# Patient Record
Sex: Female | Born: 1960 | Race: White | Hispanic: No | Marital: Married | Smoking: Former smoker
Health system: Southern US, Community
[De-identification: ages and names within clinical notes are randomized; demographics above are authoritative.]

## PROBLEM LIST (undated history)

## (undated) DIAGNOSIS — I10 Essential (primary) hypertension: Secondary | ICD-10-CM

## (undated) DIAGNOSIS — I639 Cerebral infarction, unspecified: Secondary | ICD-10-CM

## (undated) HISTORY — PX: ABDOMINAL HYSTERECTOMY: SHX81

---

## 1998-08-08 ENCOUNTER — Other Ambulatory Visit: Admission: RE | Admit: 1998-08-08 | Discharge: 1998-08-08 | Payer: Self-pay | Admitting: Gynecology

## 1999-07-23 ENCOUNTER — Encounter (INDEPENDENT_AMBULATORY_CARE_PROVIDER_SITE_OTHER): Payer: Self-pay | Admitting: Specialist

## 1999-07-23 ENCOUNTER — Encounter: Payer: Self-pay | Admitting: Gynecology

## 1999-07-23 ENCOUNTER — Ambulatory Visit (HOSPITAL_COMMUNITY): Admission: RE | Admit: 1999-07-23 | Discharge: 1999-07-23 | Payer: Self-pay | Admitting: Gynecology

## 2000-07-28 ENCOUNTER — Other Ambulatory Visit: Admission: RE | Admit: 2000-07-28 | Discharge: 2000-07-28 | Payer: Self-pay | Admitting: Gynecology

## 2001-08-01 ENCOUNTER — Other Ambulatory Visit: Admission: RE | Admit: 2001-08-01 | Discharge: 2001-08-01 | Payer: Self-pay | Admitting: Gynecology

## 2001-08-10 ENCOUNTER — Encounter: Payer: Self-pay | Admitting: Gynecology

## 2001-08-10 ENCOUNTER — Encounter: Admission: RE | Admit: 2001-08-10 | Discharge: 2001-08-10 | Payer: Self-pay | Admitting: Gynecology

## 2001-09-27 ENCOUNTER — Encounter: Payer: Self-pay | Admitting: Gynecology

## 2001-10-04 ENCOUNTER — Encounter (INDEPENDENT_AMBULATORY_CARE_PROVIDER_SITE_OTHER): Payer: Self-pay | Admitting: Specialist

## 2001-10-04 ENCOUNTER — Inpatient Hospital Stay (HOSPITAL_COMMUNITY): Admission: RE | Admit: 2001-10-04 | Discharge: 2001-10-05 | Payer: Self-pay | Admitting: Gynecology

## 2001-10-06 ENCOUNTER — Emergency Department (HOSPITAL_COMMUNITY): Admission: EM | Admit: 2001-10-06 | Discharge: 2001-10-06 | Payer: Self-pay | Admitting: Emergency Medicine

## 2002-08-08 ENCOUNTER — Other Ambulatory Visit: Admission: RE | Admit: 2002-08-08 | Discharge: 2002-08-08 | Payer: Self-pay | Admitting: Gynecology

## 2003-01-22 ENCOUNTER — Encounter: Admission: RE | Admit: 2003-01-22 | Discharge: 2003-01-22 | Payer: Self-pay | Admitting: Gynecology

## 2004-05-06 ENCOUNTER — Encounter: Admission: RE | Admit: 2004-05-06 | Discharge: 2004-05-06 | Payer: Self-pay | Admitting: Family Medicine

## 2004-05-14 ENCOUNTER — Encounter: Admission: RE | Admit: 2004-05-14 | Discharge: 2004-05-14 | Payer: Self-pay | Admitting: Family Medicine

## 2004-09-22 ENCOUNTER — Other Ambulatory Visit: Admission: RE | Admit: 2004-09-22 | Discharge: 2004-09-22 | Payer: Self-pay | Admitting: Gynecology

## 2006-04-12 ENCOUNTER — Encounter: Admission: RE | Admit: 2006-04-12 | Discharge: 2006-04-12 | Payer: Self-pay | Admitting: Gynecology

## 2006-04-14 ENCOUNTER — Other Ambulatory Visit: Admission: RE | Admit: 2006-04-14 | Discharge: 2006-04-14 | Payer: Self-pay | Admitting: Gynecology

## 2006-07-15 ENCOUNTER — Encounter: Admission: RE | Admit: 2006-07-15 | Discharge: 2006-07-15 | Payer: Self-pay | Admitting: Family Medicine

## 2007-09-20 ENCOUNTER — Encounter: Admission: RE | Admit: 2007-09-20 | Discharge: 2007-09-20 | Payer: Self-pay | Admitting: Family Medicine

## 2007-10-09 ENCOUNTER — Ambulatory Visit (HOSPITAL_COMMUNITY): Admission: RE | Admit: 2007-10-09 | Discharge: 2007-10-09 | Payer: Self-pay | Admitting: Gastroenterology

## 2008-02-06 ENCOUNTER — Encounter: Admission: RE | Admit: 2008-02-06 | Discharge: 2008-02-06 | Payer: Self-pay | Admitting: General Surgery

## 2008-04-01 ENCOUNTER — Encounter: Admission: RE | Admit: 2008-04-01 | Discharge: 2008-04-01 | Payer: Self-pay | Admitting: Gynecology

## 2009-04-02 ENCOUNTER — Encounter: Admission: RE | Admit: 2009-04-02 | Discharge: 2009-04-02 | Payer: Self-pay | Admitting: Gynecology

## 2009-11-24 IMAGING — US US ABDOMEN COMPLETE
1 series · 13 of 25 positions shown · non-contrast
Comparison: Ultrasound 04/26/2004

CLINICAL DATA: Abdominal pain, vomiting, nausea after eating fatty
foods.  Attention gallbladder.

ABDOMEN ULTRASOUND
TECHNIQUE: Complete abdominal ultrasound examination was performed
including evaluation of the liver, gallbladder, bile ducts,
pancreas, kidneys, spleen, IVC, and abdominal aorta.

[Series 1: us abdomen complete · 0.27mm/px · 13 of 81 slices shown]
[im 1/81]
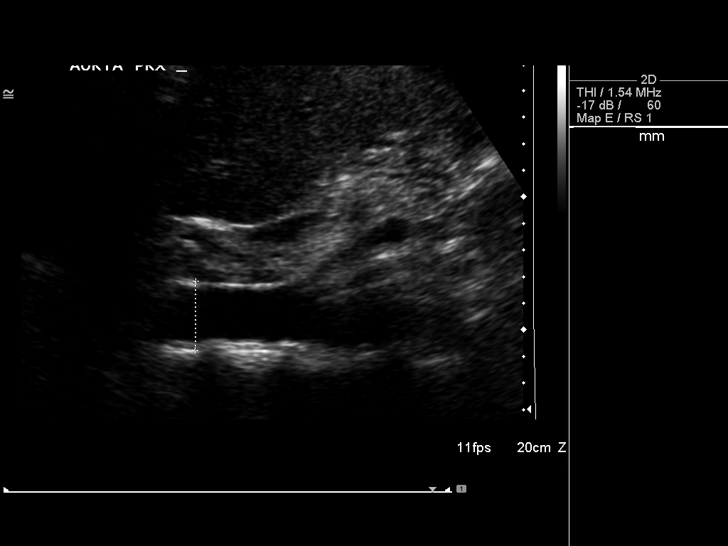
[im 7/81]
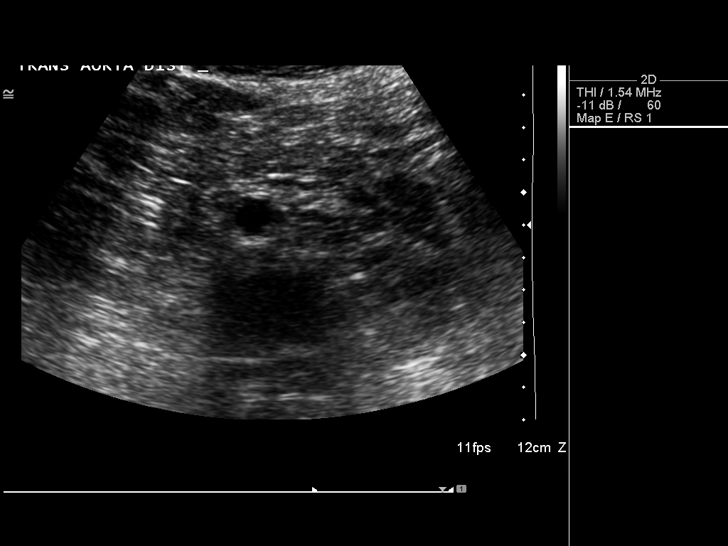
[im 14/81]
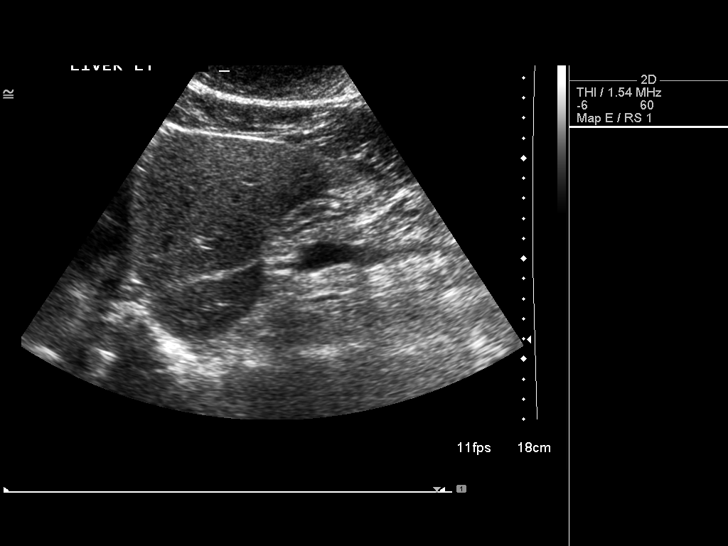
[im 21/81]
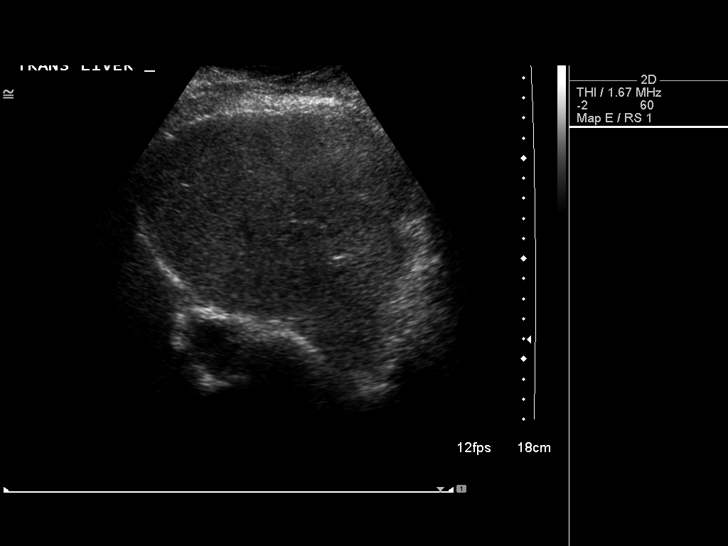
[im 27/81]
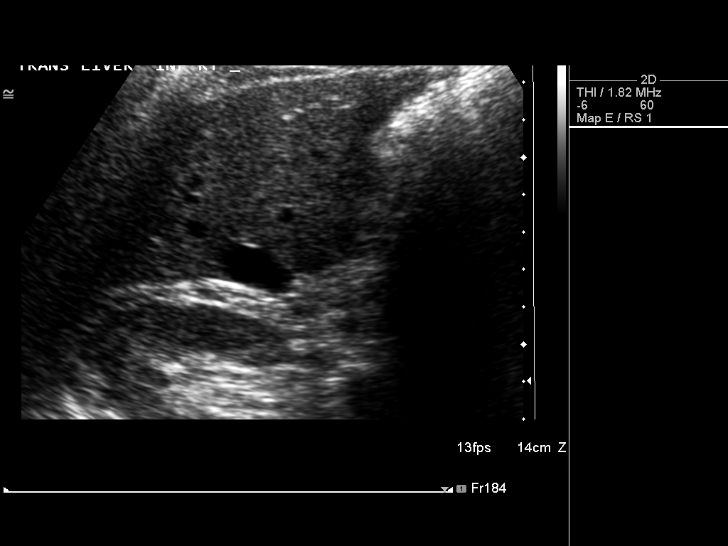
[im 34/81]
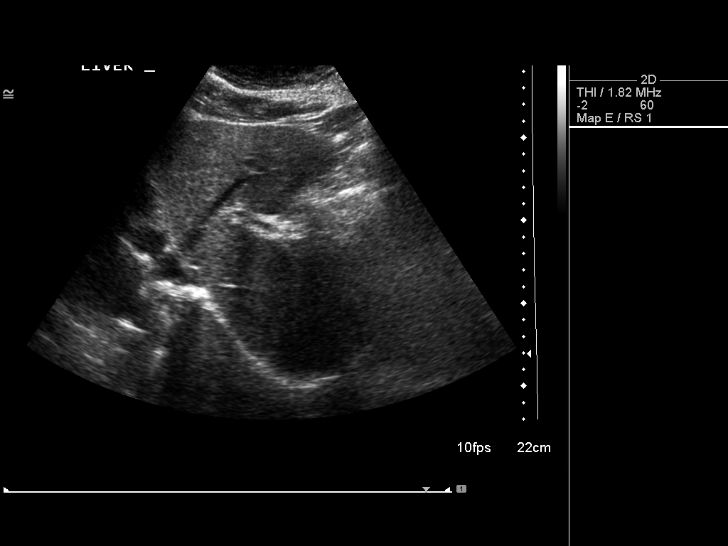
[im 41/81]
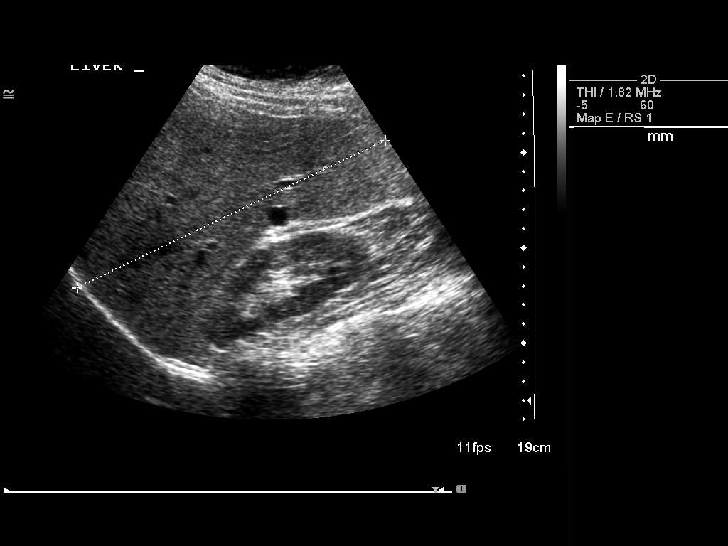
[im 47/81]
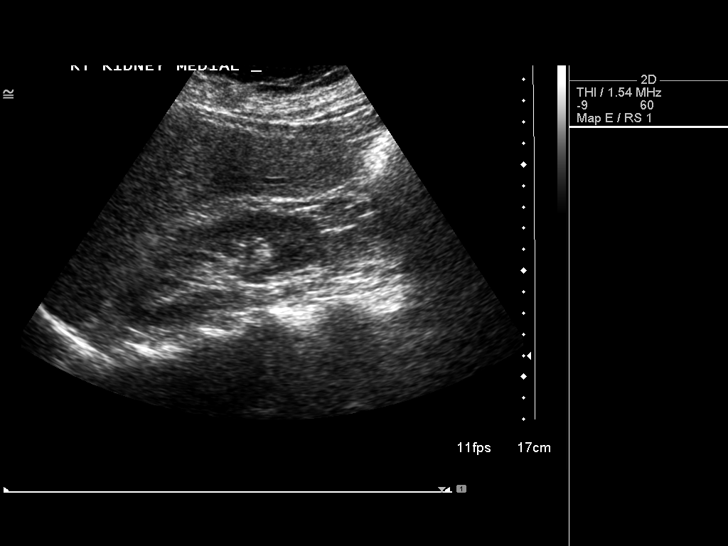
[im 54/81]
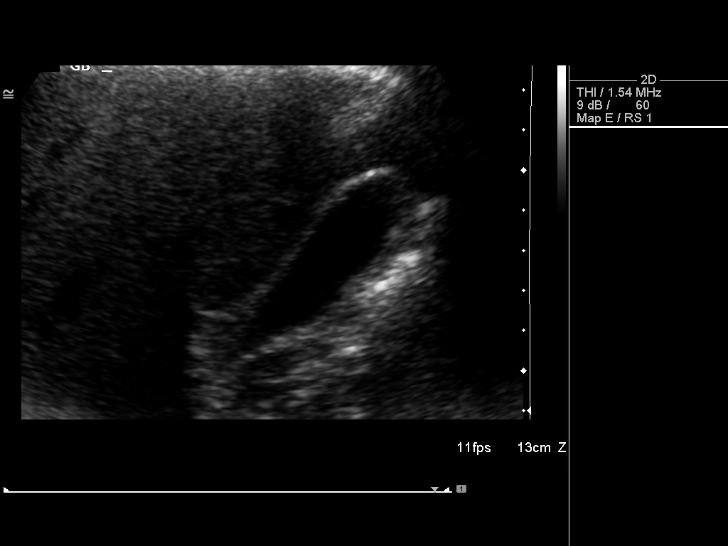
[im 61/81]
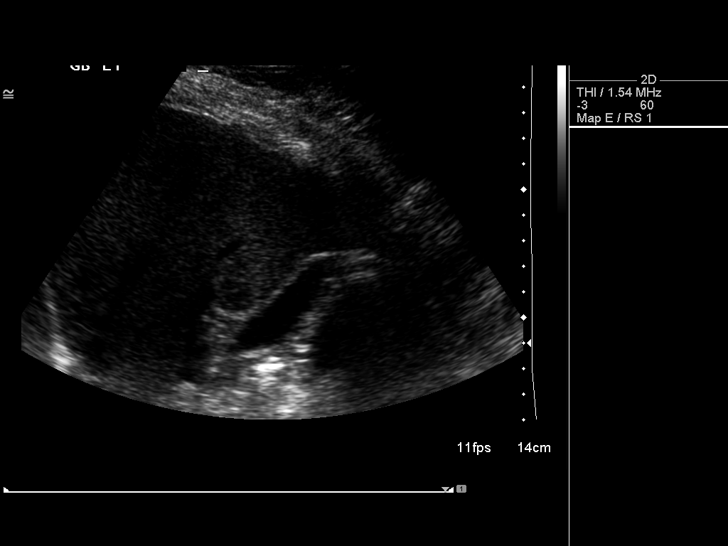
[im 67/81]
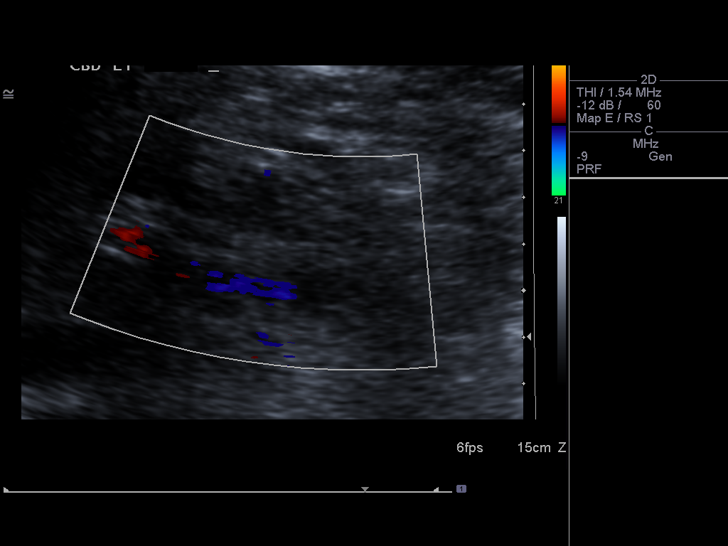
[im 74/81]
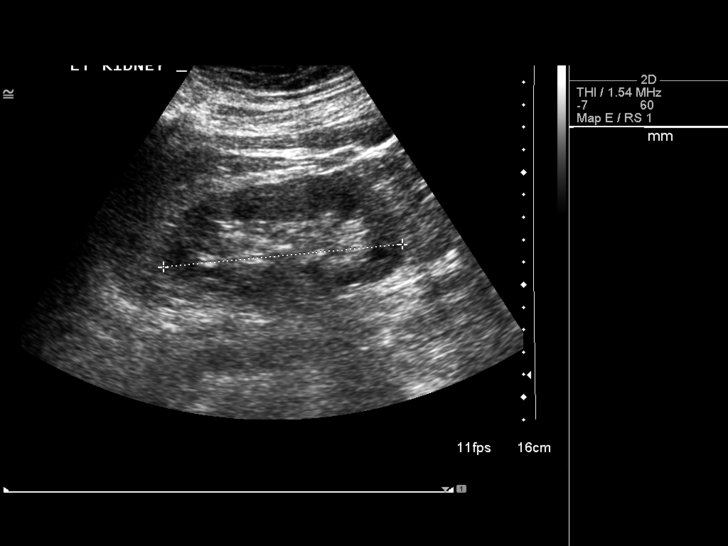
[im 81/81]
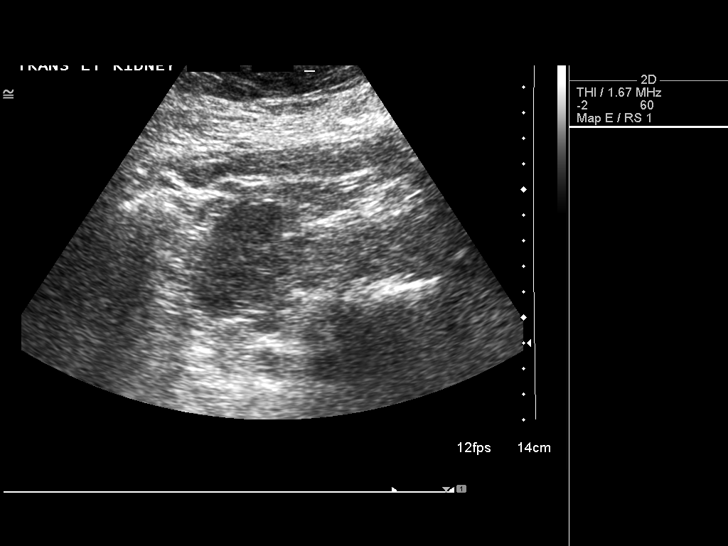

[13 of 25 positions shown; findings below may reference images not displayed]

FINDINGS: No gallstones.  Slight gallbladder wall thickening.
Gallbladder wall thickness is 3.6 mm.  No biliary ductal
dilatation.  Common duct measures 3.4 mm.  Liver appears be towards
the upper normal in size versus there may be a Reidels lobe.
Superior to inferior dimension of liver is approximate 18 cm.
Minimal diffuse increase in hepatic echogenicity potentially
represents diffuse hepatocellular disease, possibly fatty
infiltration of the liver.  Again appreciated is a simple-appearing
cyst along the inferior subcapsular aspect of the right hepatic
lobe.  Spleen is normal measuring 11.1 cm in length.  Limited
visualization of the pancreatic tail.  Patent IVC.  Abdominal aorta
maximal diameters 2.6 cm.  Normal kidneys.  Right and left kidneys
measure 11.2 cm and 10.7 cm in[length, respectively.
IMPRESSION: No gallstones.  Mild gallbladder wall thickening.  No biliary
ductal dilatation.  Liver size towards the upper limits of normal
versus Reidels lobe.  Hepatic cyst.  Moderate suspicion for mild
diffuse hepatocellular disease.

## 2010-04-19 ENCOUNTER — Emergency Department (HOSPITAL_COMMUNITY): Payer: BC Managed Care – PPO

## 2010-04-19 ENCOUNTER — Inpatient Hospital Stay (HOSPITAL_COMMUNITY)
Admission: EM | Admit: 2010-04-19 | Discharge: 2010-04-21 | DRG: 014 | Disposition: A | Discharge status: Home / Self Care | Payer: BC Managed Care – PPO | Attending: Internal Medicine | Admitting: Internal Medicine

## 2010-04-19 DIAGNOSIS — E785 Hyperlipidemia, unspecified: Secondary | ICD-10-CM | POA: Diagnosis present

## 2010-04-19 DIAGNOSIS — Z880 Allergy status to penicillin: Secondary | ICD-10-CM

## 2010-04-19 DIAGNOSIS — F411 Generalized anxiety disorder: Secondary | ICD-10-CM | POA: Diagnosis present

## 2010-04-19 DIAGNOSIS — Z886 Allergy status to analgesic agent status: Secondary | ICD-10-CM

## 2010-04-19 DIAGNOSIS — Z91199 Patient's noncompliance with other medical treatment and regimen due to unspecified reason: Secondary | ICD-10-CM

## 2010-04-19 DIAGNOSIS — I6350 Cerebral infarction due to unspecified occlusion or stenosis of unspecified cerebral artery: Principal | ICD-10-CM | POA: Diagnosis present

## 2010-04-19 DIAGNOSIS — Z9119 Patient's noncompliance with other medical treatment and regimen: Secondary | ICD-10-CM

## 2010-04-19 DIAGNOSIS — E039 Hypothyroidism, unspecified: Secondary | ICD-10-CM | POA: Diagnosis present

## 2010-04-19 DIAGNOSIS — R209 Unspecified disturbances of skin sensation: Secondary | ICD-10-CM | POA: Diagnosis present

## 2010-04-19 DIAGNOSIS — Z7982 Long term (current) use of aspirin: Secondary | ICD-10-CM

## 2010-04-19 DIAGNOSIS — Z23 Encounter for immunization: Secondary | ICD-10-CM

## 2010-04-19 DIAGNOSIS — F17200 Nicotine dependence, unspecified, uncomplicated: Secondary | ICD-10-CM | POA: Diagnosis present

## 2010-04-19 DIAGNOSIS — I10 Essential (primary) hypertension: Secondary | ICD-10-CM | POA: Diagnosis present

## 2010-04-19 DIAGNOSIS — J42 Unspecified chronic bronchitis: Secondary | ICD-10-CM | POA: Diagnosis present

## 2010-04-19 LAB — URINALYSIS, ROUTINE W REFLEX MICROSCOPIC
Leukocytes, UA: NEGATIVE
Nitrite: NEGATIVE
Specific Gravity, Urine: 1.009 (ref 1.005–1.030)
Urobilinogen, UA: 0.2 mg/dL (ref 0.0–1.0)
pH: 5.5 (ref 5.0–8.0)

## 2010-04-19 LAB — COMPREHENSIVE METABOLIC PANEL
AST: 30 U/L (ref 0–37)
Calcium: 9.1 mg/dL (ref 8.4–10.5)
Creatinine, Ser: 1.14 mg/dL (ref 0.4–1.2)
Total Protein: 6.8 g/dL (ref 6.0–8.3)

## 2010-04-19 LAB — RAPID URINE DRUG SCREEN, HOSP PERFORMED
Amphetamines: NOT DETECTED
Benzodiazepines: POSITIVE — AB
Cocaine: NOT DETECTED
Tetrahydrocannabinol: NOT DETECTED

## 2010-04-19 LAB — POCT I-STAT, CHEM 8
BUN: 11 mg/dL (ref 6–23)
Calcium, Ion: 1.04 mmol/L — ABNORMAL LOW (ref 1.12–1.32)
HCT: 41 % (ref 36.0–46.0)
Hemoglobin: 13.9 g/dL (ref 12.0–15.0)
Potassium: 3.9 mEq/L (ref 3.5–5.1)
Sodium: 139 mEq/L (ref 135–145)
TCO2: 22 mmol/L (ref 0–100)

## 2010-04-19 LAB — GLUCOSE, CAPILLARY

## 2010-04-19 LAB — DIFFERENTIAL
Eosinophils Absolute: 0.2 10*3/uL (ref 0.0–0.7)
Lymphocytes Relative: 20 % (ref 12–46)
Lymphs Abs: 1.6 10*3/uL (ref 0.7–4.0)

## 2010-04-19 LAB — CBC
MCV: 88.8 fL (ref 78.0–100.0)
Platelets: 239 10*3/uL (ref 150–400)
RDW: 13.5 % (ref 11.5–15.5)

## 2010-04-19 LAB — HEMOGLOBIN A1C: Mean Plasma Glucose: 103 mg/dL (ref <117)

## 2010-04-20 LAB — GLUCOSE, CAPILLARY
Glucose-Capillary: 109 mg/dL — ABNORMAL HIGH (ref 70–99)
Glucose-Capillary: 117 mg/dL — ABNORMAL HIGH (ref 70–99)

## 2010-04-20 LAB — LIPID PANEL
Cholesterol: 167 mg/dL (ref 0–200)
LDL Cholesterol: 108 mg/dL — ABNORMAL HIGH (ref 0–99)
Total CHOL/HDL Ratio: 5.4 RATIO
Triglycerides: 140 mg/dL (ref <150)

## 2010-04-21 LAB — CBC
HCT: 35.9 % — ABNORMAL LOW (ref 36.0–46.0)
Hemoglobin: 11.9 g/dL — ABNORMAL LOW (ref 12.0–15.0)
MCHC: 33.1 g/dL (ref 30.0–36.0)
RDW: 14 % (ref 11.5–15.5)
WBC: 6 10*3/uL (ref 4.0–10.5)

## 2010-04-21 LAB — COMPREHENSIVE METABOLIC PANEL
ALT: 19 U/L (ref 0–35)
AST: 18 U/L (ref 0–37)
Albumin: 3.5 g/dL (ref 3.5–5.2)
Alkaline Phosphatase: 51 U/L (ref 39–117)
Calcium: 8.9 mg/dL (ref 8.4–10.5)
GFR calc Af Amer: >60 mL/min (ref >60)
Glucose, Bld: 102 mg/dL — ABNORMAL HIGH (ref 70–99)
Potassium: 4.2 mEq/L (ref 3.5–5.1)
Sodium: 138 mEq/L (ref 135–145)
Total Protein: 6.6 g/dL (ref 6.0–8.3)

## 2010-04-21 LAB — GLUCOSE, CAPILLARY
Glucose-Capillary: 114 mg/dL — ABNORMAL HIGH (ref 70–99)
Glucose-Capillary: 94 mg/dL (ref 70–99)

## 2010-04-22 ENCOUNTER — Ambulatory Visit: Payer: BC Managed Care – PPO | Admitting: Occupational Therapy

## 2010-04-22 ENCOUNTER — Ambulatory Visit: Payer: BC Managed Care – PPO | Admitting: Physical Therapy

## 2010-04-24 ENCOUNTER — Ambulatory Visit: Payer: BC Managed Care – PPO | Attending: Neurology | Admitting: Occupational Therapy

## 2010-04-24 ENCOUNTER — Ambulatory Visit: Payer: BC Managed Care – PPO | Admitting: Physical Therapy

## 2010-04-24 DIAGNOSIS — M6281 Muscle weakness (generalized): Secondary | ICD-10-CM | POA: Insufficient documentation

## 2010-04-24 DIAGNOSIS — I69998 Other sequelae following unspecified cerebrovascular disease: Secondary | ICD-10-CM | POA: Insufficient documentation

## 2010-04-24 DIAGNOSIS — R279 Unspecified lack of coordination: Secondary | ICD-10-CM | POA: Insufficient documentation

## 2010-04-24 DIAGNOSIS — Z5189 Encounter for other specified aftercare: Secondary | ICD-10-CM | POA: Insufficient documentation

## 2010-04-24 DIAGNOSIS — R269 Unspecified abnormalities of gait and mobility: Secondary | ICD-10-CM | POA: Insufficient documentation

## 2010-04-24 DIAGNOSIS — I69919 Unspecified symptoms and signs involving cognitive functions following unspecified cerebrovascular disease: Secondary | ICD-10-CM | POA: Insufficient documentation

## 2010-04-26 NOTE — Discharge Summary (Signed)
  NAMEHELOISE, Whitney Rasmussen               ACCOUNT NO.:  [id number]  MEDICAL RECORD NO.:  [id number]           PATIENT TYPE:  I  LOCATION:  3016                         FACILITY:  [facility]  PHYSICIAN:  Demaryius Imran I Terilynn Buresh, MD      DATE OF BIRTH:  1960-10-04  DATE OF ADMISSION:  04/19/2010 DATE OF DISCHARGE:  04/21/2010                              DISCHARGE SUMMARY   PRIMARY CARE PHYSICIAN:  Marjory Lies, MD at [facility].  DISCHARGE DIAGNOSES: 1. Acute left hemispheric lacunar infarct. 2. Hypertension, noncompliance with medication. 3. On going tobacco abuse. 4. Hyperlipidemia. 5. Hypothyroidism. 6. Anxiety.  DISCHARGE MEDICATIONS: 1. Synthroid 115 mcg p.o. daily. 2. Xanax 0.5 mg p.o. b.i.d. 3. Aspirin 325 mg p.o. daily. 4. Lisinopril 10 mg p.o. daily. 5. Norvasc 5 mg p.o. daily. 6. Zocor 20 mg p.o. at bedtime.  PROCEDURE: 1. MRI of the brain, which did show acute small nonhemorrhagic infarct     extended from the posterior as well as the left lacunar into the     posterior limb of the left internal capsule and the posterior axis     __________, nonspecific white matter type changes suspicious with     the results of chronic ischemic/small vessel disease, partial     ampulla. 2. Chest x-ray, mild-moderate chronic bronchitis. 3. CT of the head, age-indeterminate lacunar infarct in the anterior     limb of the left internal capsule.  A 2-D echo with false tendon in     the left ventricular apex, which is mildly mobile of no clinical     significance.  Activity normal.  AF normal.  Systolic function     normal, ejections 55-60%.  Carotid duplex, no evidence of ICA     stenosis.  CONSULTATION:  Neurology consulted.  HISTORY OF PRESENT ILLNESS:  This is a 50 year old female with history of hypertension, noncompliance with her medication, hypothyroidism, anxiety, ongoing tobacco abuse who presented with numbness of her right leg and arm.  She felt there is nothing, but  the next day, she continued to have the numbness on her right hand and legs.  On presentation, heart rate was 100, respiratory rate 20, and saturating 96% on room air, temperature 97.  Acute left brain stroke with some residual right upper and lower extremity numbness.  Admitted to the hospital, MRI confirmed the presence of acute left lacunar infarct.  Neurology recommended continuing aspirin 325 mg for secondary prevention.  The patient was advised to compliance with her medication including blood pressure medications and aspirin.  The patient was advised to quit smoking.  I did recommend to follow with Dr. Pearlean Brownie within 1-2 months.  The patient was advised to follow up with her primary care physician next week.     Yomaris Palecek Bosie Helper, MD     HIE/MEDQ  D:  04/21/2010  T:  04/22/2010  Job:  [id number]  cc:   Marjory Lies, M.D.  Electronically Signed by Ebony Cargo MD on 04/24/2010 06:56:18 PM

## 2010-04-27 ENCOUNTER — Ambulatory Visit: Payer: BC Managed Care – PPO | Admitting: Physical Therapy

## 2010-04-27 ENCOUNTER — Ambulatory Visit: Payer: BC Managed Care – PPO | Admitting: Occupational Therapy

## 2010-04-30 ENCOUNTER — Ambulatory Visit: Payer: BC Managed Care – PPO | Admitting: Physical Therapy

## 2010-04-30 ENCOUNTER — Ambulatory Visit: Payer: BC Managed Care – PPO | Admitting: Occupational Therapy

## 2010-05-01 NOTE — H&P (Signed)
NAME:  [name], [name] NO.:  [id number]  MEDICAL RECORD NO.:  [id number]           PATIENT TYPE:  E  LOCATION:  [facility]                         FACILITY:  [facility]  PHYSICIAN:  Peggye Pitt, M.D. DATE OF BIRTH:  [birth year]  DATE OF ADMISSION:  04/19/2010 DATE OF DISCHARGE:                             HISTORY & PHYSICAL   PRIMARY CARE PHYSICIAN:  Marjory Lies, MD with [facility]  CHIEF COMPLAINTS:  Right-sided numbness and weakness.  HISTORY OF PRESENT ILLNESS:  [name] is a pleasant 50 year old white lady with no past medical history other than hypothyroidism and anxiety, who last night at approximately 7:45 p.m. dosed off while watching Jeopardy and awoke with some right leg and arm numbness.  She thought that her legs had just gone to sleep so she stood up and went to prepare supper.  When she went to grab the chips and dip, they fell right out of her left hand and fell on the floor.  She thought nothing of this and just went to bed.  When she awoke this morning she noticed that she had to drag her right leg when walking and because of this she finally decided to come to the emergency department.  Upon further questioning, she denies any chest pain, shortness of breath, any headache, or any other issues.  ALLERGIES:  She has stated allergies to PENICILLIN and CODEINE which cause "dyspnea."  PAST MEDICAL HISTORY:  Significant for: 1. Hypothyroidism. 2. Anxiety. 3. Tobacco abuse.  HOME MEDICATIONS: 1. Synthroid 115 mcg daily. 2. Xanax 0.5 mg twice daily.  SOCIAL HISTORY:  She denies any alcohol or illicit drug use.  She does smoke about 10-15 cigarettes a day and has done so since age 50.  She is married and her husband Michele Mcalpine is present at the time of my exam and interview.  FAMILY HISTORY:  Noncontributory for stroke, heart disease, hypertension, or diabetes.  REVIEW OF SYSTEMS:  Negative except as mentioned in history of  present illness.  PHYSICAL EXAMINATION:  VITAL SIGNS:  On admission, blood pressure 180/111, heart rate 100, respirations 20, sats of 96% on room air, and a temperature of 97.9. GENERAL:  She is alert, awake, and oriented x3, does not appear to be in any distress.  She reeks of tobacco. HEENT: Normocephalic, atraumatic.  Her pupils are equally round and reactive to light and accommodation.  She has intact extraocular movements.  She wears corrective lenses. NECK:  Supple.  No JVD, no lymphadenopathy, no bruits, no goiter. HEART:  Regular rate and rhythm with no murmurs, rubs, or gallops. LUNGS:  Clear to auscultation bilaterally. ABDOMEN:  Soft, nontender, nondistended.  Positive bowel sounds. EXTREMITIES:  She has no edema with positive pedal pulses. NEUROLOGIC:  Her mental status is intact.  Her cranial nerves II-XII are intact.  Her sensation is intact to light touch.  She does have a right- sided pronator drift.  Finger-nose on the right is clumsy, is normal on the left.  She is not able to perform heel-to-shin on the right.  She is able to wiggle both toes.  Her proprioception appears to  be intact bilaterally.  Her Babinski is downgoing bilaterally.  I have not ambulated her.  LABORATORY DATA ON ADMISSION:  Sodium 139, potassium 3.9, chloride 107, bicarb 22, BUN 11, creatinine 1.1, glucose of 93.  WBC 7.9, hemoglobin 13.3, platelets of 239, and alcohol level of less than 5.  Chest x-ray shows mild chronic bronchitic changes. CT scan of the head shows an age-indeterminate, likely subacute, lacunar infarct in the anterior limb of the left internal capsule.  ASSESSMENT AND PLAN: 1. Subacute left brain cerebrovascular accident, with residual right     upper and lower extremity weakness and numbness.  At this point, we     will admit her to telemetry.  We will start a stroke workup to     include carotid Dopplers, 2D echo, fasting lipid profile.  We will     start her on aspirin  325 mg daily.  Her only vascular disease risk     factor at this point appears to be tobacco, so she will be     aggressively counseled against tobacco use at this time.  I will     also get Physical, Speech, and Occupational Therapy to evaluate her     while in the hospital. 2. For hypothyroidism we will check a TSH and continue her home dose     of Synthroid. 3. For her anxiety, we will continue her Xanax. 4. For DVT prophylaxis she will be on Lovenox.     Peggye Pitt, M.D.     EH/MEDQ  D:  04/19/2010  T:  04/19/2010  Job:  [id number]  cc:   Marjory Lies, M.D.  Electronically Signed by Peggye Pitt M.D. on 05/01/2010 05:08:12 PM

## 2010-05-03 NOTE — Consult Note (Signed)
NAME:  Whitney Rasmussen, SCHILLO NO.:  [id number]  MEDICAL RECORD NO.:  [id number]           PATIENT TYPE:  I  LOCATION:  3016                         FACILITY:  [facility]  PHYSICIAN:  Thana Farr, MD    DATE OF BIRTH:  Aug 12, 1960  DATE OF CONSULTATION:  04/19/2010 DATE OF DISCHARGE:                                CONSULTATION   HISTORY:  Ms. Tuite is a 50 year old female who reports that on yesterday, she noticed acutely that she was having difficulty using her right foot and having difficulty using her right arm as well.  She was trying to eat, and had difficulty dipping with her right hand.  In using the computer, she was having difficulty as well.  The patient felt it was secondary to a pinched nerve.  She did not present until today when her symptoms persisted.  NIH stroke scale on presentation is 2.  PAST MEDICAL HISTORY:  Hypertension, the patient is noncompliant with medications, and hypothyroidism.  MEDICATIONS AT HOME:  Synthroid and Xanax.  SOCIAL HISTORY:  The patient smokes half pack a day of cigarettes. There is no history of alcohol or illicit drug abuse.  She works in an [employer], [employer].  PHYSICAL EXAMINATION:  VITAL SIGNS:  Blood pressure 167/119, heart rate 82, respiratory rate 14, temperature 97.8, O2 sat 96% on room air. NEUROLOGIC:  Mental status:  The patient is alert and oriented x3. Speech is full without aphasia.  The patient follow commands without difficulty.  Cranial nerve testing:  II, disk flat bilaterally.  Visual fields grossly intact.  III, IV, VI, extraocular movements intact.  V and VII, smile symmetric.  VIII, grossly intact.  IX and VII, positive gag.  XI, bilateral shoulder shrug.  XII, midline tongue extension.  On motor exam, the patient has no pronator drift, but does have curling of the fingertips on the right with pronator drift testing.  She gives 5-/5 hand grip on the right with 5-/5 strength throughout in the right  upper extremity.  She is 5/5 on the left.  There is 4/5 strength in the right lower extremity proximally.  The patient is 5/5 on the left lower extremity.  She is 5/5 distally in both lower extremities.  On sensory testing, she has decreased pinprick and light touch in the right upper extremity.  Otherwise, sensory exam is intact.  Deep tendon reflexes are 2+ in the right upper extremity and right lower extremity.  They are 1+ in the left upper extremity and left lower extremity.  Plantars are mute bilaterally.  On cerebellar testing, there is some slight dysmetria with finger-to-nose and heel-to-shin testing on the right.  LABORATORY TESTS:  Sodium is 139, potassium 3.9, chloride is 107, CO2 of 22, BUN and creatinine are 11 and 1.1 respectively.  Glucose was 93. Hemoglobin and hematocrit was 13.3 and 39.8 respectively.  White blood cell count of 7.9, platelet count is 239.  Urinalysis is unremarkable. CT shows a age-indeterminate lacunar infarct in the anterior limb of the left internal capsule.  ASSESSMENT:  Acute left hemispheric lacunar infarct.  PLAN: 1. MRI of the brain without contrast.  2. Aspirin 325 mg to be given today and maintained daily as long-term     stroke prophylaxis. 3. Echo and carotid. 4. The patient advised to stop smoking and be compliant with     antihypertensives.          ______________________________ Thana Farr, MD     LR/MEDQ  D:  04/19/2010  T:  04/20/2010  Job:  [id number]  Electronically Signed by Thana Farr MD on 05/03/2010 12:19:15 AM

## 2010-05-04 ENCOUNTER — Ambulatory Visit: Payer: BC Managed Care – PPO | Admitting: Physical Therapy

## 2010-05-04 ENCOUNTER — Ambulatory Visit: Payer: BC Managed Care – PPO | Admitting: Occupational Therapy

## 2010-05-06 ENCOUNTER — Ambulatory Visit: Payer: BC Managed Care – PPO | Admitting: Occupational Therapy

## 2010-05-06 ENCOUNTER — Ambulatory Visit: Payer: BC Managed Care – PPO | Admitting: Physical Therapy

## 2010-05-12 ENCOUNTER — Encounter: Payer: BC Managed Care – PPO | Admitting: Occupational Therapy

## 2010-05-12 ENCOUNTER — Ambulatory Visit: Payer: BC Managed Care – PPO | Admitting: Physical Therapy

## 2010-05-14 ENCOUNTER — Ambulatory Visit: Payer: BC Managed Care – PPO | Attending: Neurology | Admitting: Occupational Therapy

## 2010-05-14 ENCOUNTER — Ambulatory Visit: Payer: BC Managed Care – PPO | Admitting: Physical Therapy

## 2010-05-14 DIAGNOSIS — M6281 Muscle weakness (generalized): Secondary | ICD-10-CM | POA: Insufficient documentation

## 2010-05-14 DIAGNOSIS — R269 Unspecified abnormalities of gait and mobility: Secondary | ICD-10-CM | POA: Insufficient documentation

## 2010-05-14 DIAGNOSIS — Z5189 Encounter for other specified aftercare: Secondary | ICD-10-CM | POA: Insufficient documentation

## 2010-05-14 DIAGNOSIS — R279 Unspecified lack of coordination: Secondary | ICD-10-CM | POA: Insufficient documentation

## 2010-05-14 DIAGNOSIS — I69998 Other sequelae following unspecified cerebrovascular disease: Secondary | ICD-10-CM | POA: Insufficient documentation

## 2010-05-14 DIAGNOSIS — I69919 Unspecified symptoms and signs involving cognitive functions following unspecified cerebrovascular disease: Secondary | ICD-10-CM | POA: Insufficient documentation

## 2010-05-18 ENCOUNTER — Ambulatory Visit: Payer: BC Managed Care – PPO | Admitting: Occupational Therapy

## 2010-05-18 ENCOUNTER — Ambulatory Visit: Payer: BC Managed Care – PPO | Admitting: Physical Therapy

## 2010-05-21 ENCOUNTER — Ambulatory Visit: Payer: BC Managed Care – PPO | Admitting: Occupational Therapy

## 2010-05-21 ENCOUNTER — Ambulatory Visit: Payer: BC Managed Care – PPO | Admitting: Physical Therapy

## 2010-05-27 ENCOUNTER — Encounter: Payer: BC Managed Care – PPO | Admitting: Occupational Therapy

## 2010-05-29 ENCOUNTER — Ambulatory Visit: Payer: BC Managed Care – PPO | Admitting: Physical Therapy

## 2010-06-03 ENCOUNTER — Ambulatory Visit: Payer: BC Managed Care – PPO | Admitting: Occupational Therapy

## 2010-06-03 ENCOUNTER — Ambulatory Visit: Payer: BC Managed Care – PPO | Admitting: Physical Therapy

## 2010-06-05 ENCOUNTER — Ambulatory Visit: Payer: BC Managed Care – PPO | Admitting: Physical Therapy

## 2010-06-05 ENCOUNTER — Encounter: Payer: BC Managed Care – PPO | Admitting: Occupational Therapy

## 2010-06-08 ENCOUNTER — Encounter: Payer: BC Managed Care – PPO | Admitting: Occupational Therapy

## 2010-06-08 ENCOUNTER — Ambulatory Visit: Payer: BC Managed Care – PPO | Admitting: Physical Therapy

## 2010-06-09 ENCOUNTER — Ambulatory Visit: Payer: BC Managed Care – PPO | Admitting: Physical Therapy

## 2010-06-09 ENCOUNTER — Encounter: Payer: BC Managed Care – PPO | Admitting: Occupational Therapy

## 2010-07-31 NOTE — H&P (Signed)
[facility]  Patient:    Whitney Rasmussen, Whitney Rasmussen Visit Number: [id number] MRN: [id number]          Service Type: EMS Location: ED Attending Physician:  Cathren Laine Dictated by:   Gretta Cool, M.D. Admit Date:  10/06/2001   CC:         Delorse Lek, M.D.   History and Physical  HISTORY OF PRESENT ILLNESS:  The patient is a 50 year old gravida 4, para 2, abortus 2, under the primary care of Dr. Doristine Counter.  She has a history of hysteroscopy, resection, ablation for severe menorrhagia Jul 23, 1999.  She had complete ablation and did well with complete amenorrhea until January 2003, when she began to have a small amount of bleeding again.  She then had progressive cramping lasting two weeks out of each month, progressively more severe, to the point that she has difficulty tolerating the discomfort now. She is now admitted for vaginal hysterectomy.  PAST MEDICAL HISTORY: 1. Usual childhood diseases without sequelae. 2. Thyroid problems with hypothyroidism, on hormone replacement, Synthroid, at    0.175.  PAST SURGICAL HISTORY: 1. History of T&A as a child. 2. History of tubal ligation after her second child. 3. Jul 23, 1999, hysteroscopy, resection, ablation total.  PRESENT MEDICATIONS: 1. Wellbutrin 100 mg per day for smoking cessation. 2. Xanax 0.25 mg per day for anxiety. 3. Synthroid 0.175 mg daily. 4. Allegra as needed for seasonal allergies.  HABITS:  Denies ethanol, but she is a one-pack-per-day smoker.  Denies recreational drugs.  FAMILY HISTORY:  Father died of heart disease, age 61.  Mother has an ileostomy and colectomy for ulcerative colitis.  One sister is hypertensive. Brother and sister have hypercholesterolemia.  One brother had melanoma and diabetes.  Father was also diabetic.  REVIEW OF SYSTEMS:  HEENT:  Denies symptoms ______ asthma, cough, bronchitis, shortness of breath.  GASTROINTESTINAL/GENITOURINARY:  Denies  frequency, urgency, dysuria, change in bowel habits, food intolerance.  MENSTRUAL HISTORY:  As above.  PHYSICAL EXAMINATION:  GENERAL:  Well-developed, well-nourished white female, considerably over ideal weight, with high abdomen to hip ratio.  HEENT:  Pupils are equal and reactive to light and accommodation.  Fundi not examined.  Oropharynx clear.  NECK:  Supple.  Without mass or thyroid enlargement.  BREASTS:  Soft.  Without mass or nodes.  No nipple discharge.  HEART:  Regular rhythm.  Without murmur or cardiac enlargement.  ABDOMEN:  Soft.  With large panniculus.  Without organomegaly.  PELVIC:  External genitalia normal female.  Vagina is clean, rugose.  Pelvic support is quite adequate.  Cervix is parous, clean.  Uterus is firm, mid position.  Adnexa clear.  RECTOVAGINAL:  Confirms.  LABORATORY DATA:  On ultrasound examination the patient has no discernible endometrial cavity.  Saline infusion attempted but failed to define an endometrial cavity.  No evidence of endometrioma, but suspicious areas suggesting adenomyosis.  IMPRESSION: 1. Incapacitating cyclic pelvic pain. 2. Abnormal uterine bleeding. 3. History of hysteroscopy, resection, ablation total with amenorrhea x 2    years, now recurrence of bleeding and pain. 4. Hypothyroid, on replacement. 5. Tobacco use.  PLAN:  I have discussed all alternatives, options.  She has failed conservative therapy and recommended on to vaginal hysterectomy, possible abdominal hysterectomy.  She understands all the risk to benefit ratios, alternatives.  Her questions were answered. Dictated by:   Gretta Cool, M.D. Attending Physician:  Cathren Laine DD:  10/04/01 TD:  10/08/01 Job: [id number]

## 2010-07-31 NOTE — Op Note (Signed)
[facility]  Patient:    Whitney Rasmussen, Whitney Rasmussen                        MRN: [id number] Proc. Date: 07/23/99 Adm. Date:  [id number] Disc. Date: [id number] Attending:  Katrina Stack CC:         Carmelina Paddock, M.D. - [facility]                           Operative Report  PREOPERATIVE DIAGNOSIS:  Dysfunctional uterine bleeding, recurrent, and unresponsive to conservative therapy.  POSTOPERATIVE DIAGNOSIS:  Dysfunctional uterine bleeding, recurrent, and unresponsive to conservative therapy.  PROCEDURE:  Hysteroscopy, resection of the endometrium total for ablation, plus  VaporTrode.  SURGEON:  Gretta Cool, M.D.  ANESTHESIA:  IV sedation and paracervical block 1% Xylocaine.  DESCRIPTION OF PROCEDURE:  Under excellent IV sedation with a paracervical block, the cervix was progressively dilated to #33 Shawnie Pons.  The 7 mm resectoscope was then introduced and the endometrial cavity documented by photographs.  The entire endometrial cavity was resected approximately 5.0 mm down into the myometrium, o as to remove all of the endometrial tissue in so far as possible, once the entire endometrial cavity was totally resected, with a 90-degree resectoscope loop. The VaporTrode was used to treat the entire endometrial cavity so as to eliminate any islands of viable endometrial tissue.  At this point the procedure was terminated without complications and at reduced pressure there was no significant bleeding.  The patient returned to the recovery room in excellent condition. DD:  07/23/99 TD:  07/25/99 Job: [id number]

## 2010-07-31 NOTE — Op Note (Signed)
[facility]  Patient:    Whitney Rasmussen, Whitney Rasmussen Visit Number: [id number] MRN: [id number]          Service Type: EMS Location: ED Attending Physician:  Cathren Laine Dictated by:   Gretta Cool, M.D. Proc. Date: 10/04/01 Admit Date:  10/06/2001   CC:         Delorse Lek, M.D.   Operative Report  PREOPERATIVE DIAGNOSES: 1. Abnormal uterine bleeding. 2. Incapacitating cyclic pelvic pain.  POSTOPERATIVE DIAGNOSES: 1. Abnormal uterine bleeding. 2. Incapacitating cyclic pelvic pain.  PROCEDURE:  Vaginal hysterectomy.  SURGEON:  Gretta Cool, M.D.  ASSISTANT:  Raynald Kemp, M.D.  ANESTHESIA:  General orotracheal.  DESCRIPTION OF PROCEDURE:  Under excellent general anesthesia with patient prepped and draped in lithotomy position with her bladder drained by Foley catheter, a single-tooth tenaculum was used to grasp the cervix.  The cervical mucosa was then incised after Xylocaine with epinephrine.  The mucosa was pushed off the lower segment.  Cul-de-sac was then entered, and the uterosacral ligaments clamped, cut, sutured, and tied.  The cardinal ligaments were then also clamped, cut, sutured, and tied with 0 Vicryl.  The anterior vesical vaginal plica was then opened and a Deaver placed beneath the bladder in the peritoneal cavity.  The uterine vessels were then clamped, cut, sutured, and tied with 0 Vicryl.  The uterus was then inverted and the adnexal structures clamped across.  The pedicles were first ligated with a single free tie and then sutured with 0 Vicryl. The uterus was thus removed.  The pedicles were examined and were dry.  There was no bleeding of significance.  The ovaries and tubes appeared normal.  No evidence of other pelvic pathology. The anterior peritoneum was then secured with Allis clamps.  The peritoneum was sutured in pursestring fashion so as to close the peritoneum entirely. Redundant enterocele peritoneum was  excised.  A cardinal uterosacral colposuspension was then performed, securing the cardinal and uterosacral ligaments to anterior vaginal wall fascia and to the posterior vagina fascia on each side.  The cuff was then closed transversely with a running suture of 2-0 Vicryl subcuticular.  Great care was taken to include all of the envelope of endopelvic fascia and perirectal fascia.  At this point, the sponge and lap counts were correct.  There were no complications.  The patient returned to recovery room in excellent condition. Dictated by:   Gretta Cool, M.D. Attending Physician:  Cathren Laine DD:  10/04/01 TD:  10/07/01 Job: [id number]

## 2010-07-31 NOTE — Discharge Summary (Signed)
NAME:  Whitney Rasmussen, Whitney Rasmussen                         ACCOUNT NO.:  [id number]   MEDICAL RECORD NO.:  [id number]                   PATIENT TYPE:  EMS   LOCATION:  ED                                   FACILITY:  [facility]   PHYSICIAN:  Gretta Cool, M.D.              DATE OF BIRTH:  05/06/1960   DATE OF ADMISSION:  10/04/2001  DATE OF DISCHARGE:  10/05/2001                                 DISCHARGE SUMMARY   HISTORY OF PRESENT ILLNESS:  The patient is a 50 year old white married  female gravida 4, para 2, abortus 2 who has a history of hysteroscopy  resection and ablation for severe menorrhagia in May 2001.  She did well  with complete amenorrhea until January 2003 when she began small amounts of  bleeding again.  This progressed to severe cramping which lasted two weeks  out of each month to the point of difficulty tolerating it at present.  She  is now admitted for vaginal hysterectomy.   PHYSICAL EXAMINATION:  CHEST:  Clear to A&P.  HEART:  Rate and rhythm were regular without murmur, gallop, or cardiac  enlargement.  ABDOMEN:  Soft and scaphoid without masses or organomegaly.  PELVIC:  External genitalia within normal limits for female.  Vagina clean  and rugose.  Cervix is parous and clean.  Uterus firm and mid position.  Adnexa bilaterally clear.  Pelvic support is adequate.  Rectovaginal  examination confirms.   LABORATORIES:  On ultrasound evaluation there are no discernible endometrial  cavity.  Saline infusion attempted, but failed to define the cavity.  There  is no evidence of endometrioma, but suspicious areas suggesting adenomyosis.   IMPRESSION:  1. Incapacitating cyclic pelvic pain.  2. Abnormal uterine bleeding.  3. History of hysteroscopy resection and ablation with amenorrhea x2 years     and recurrence of pain and bleeding.  4. Hypothyroidism replacement.  5. Tobacco use.   PLAN:  Risks and benefits have been discussed with the patient as well as  conservative  and alternative methods.  Recommendation is to proceed with  vaginal hysterectomy and possible abdominal hysterectomy.   LABORATORY DATA:  Admission hemoglobin 14.0, hematocrit 40.4.  Chest x-ray:  Normal study.   HOSPITAL COURSE:  The patient underwent vaginal hysterectomy under general  anesthesia.  The procedure was completed without any complications and the  patient was returned to the recovery room in excellent condition.  Pathology  report:  Slight cervicitis and squamo metaplasia, no dysplasia identified,  proliferative endometrium without hyperplasia or malignancy, adenomyosis,  leiomyoma intramural, benign uterine serosa.  Her postoperative course was  without complications and she was discharged on the first postoperative day  in excellent condition.   FINAL DISCHARGE INSTRUCTIONS:  No heavy lifting or straining.  No vaginal  entrance.  Increase ambulation as tolerated.  She is to call for any fever  over 100.5 or failure of daily improvement.  Diet:  Regular.   DISCHARGE MEDICATIONS:  1. Vioxx 25 mg daily.  2. Tylox one p.o. q.2-4h. p.r.n. discomfort.   At the time of discharge the suprapubic catheter was removed and she was  voiding without difficulty.   FINAL DISCHARGE DIAGNOSES:  1. Abnormal uterine bleeding.  2. Incapacitating cyclic pelvic pain.  3. 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   PROCEDURES PERFORMED:  Vaginal hysterectomy under general anesthesia.      Matt Holmes, N.P.                          Gretta Cool, M.D.    EMK/MEDQ  D:  10/23/2001  T:  10/26/2001  Job:  [id number]   cc:   Delorse Lek, M.D.

## 2011-03-18 ENCOUNTER — Other Ambulatory Visit: Payer: Self-pay | Admitting: Gynecology

## 2011-03-18 DIAGNOSIS — Z1231 Encounter for screening mammogram for malignant neoplasm of breast: Secondary | ICD-10-CM

## 2011-03-24 ENCOUNTER — Other Ambulatory Visit: Payer: Self-pay | Admitting: Gynecology

## 2011-03-25 ENCOUNTER — Ambulatory Visit
Admission: RE | Admit: 2011-03-25 | Discharge: 2011-03-25 | Disposition: A | Discharge status: Home / Self Care | Payer: BC Managed Care – PPO | Source: Ambulatory Visit | Attending: Gynecology | Admitting: Gynecology

## 2011-03-25 DIAGNOSIS — Z1231 Encounter for screening mammogram for malignant neoplasm of breast: Secondary | ICD-10-CM

## 2011-04-01 ENCOUNTER — Emergency Department (HOSPITAL_COMMUNITY)
Admission: EM | Admit: 2011-04-01 | Discharge: 2011-04-01 | Disposition: A | Discharge status: Home / Self Care | Payer: BC Managed Care – PPO | Attending: Emergency Medicine | Admitting: Emergency Medicine

## 2011-04-01 ENCOUNTER — Encounter (HOSPITAL_COMMUNITY): Payer: Self-pay | Admitting: *Deleted

## 2011-04-01 DIAGNOSIS — I10 Essential (primary) hypertension: Secondary | ICD-10-CM | POA: Insufficient documentation

## 2011-04-01 DIAGNOSIS — Z8679 Personal history of other diseases of the circulatory system: Secondary | ICD-10-CM | POA: Insufficient documentation

## 2011-04-01 DIAGNOSIS — R339 Retention of urine, unspecified: Secondary | ICD-10-CM

## 2011-04-01 HISTORY — DX: Essential (primary) hypertension: I10

## 2011-04-01 HISTORY — DX: Cerebral infarction, unspecified: I63.9

## 2011-04-01 LAB — URINALYSIS, ROUTINE W REFLEX MICROSCOPIC
Leukocytes, UA: NEGATIVE
Nitrite: NEGATIVE
Protein, ur: NEGATIVE mg/dL
Specific Gravity, Urine: 1.008 (ref 1.005–1.030)
Urobilinogen, UA: 0.2 mg/dL (ref 0.0–1.0)

## 2011-04-01 LAB — URINE MICROSCOPIC-ADD ON

## 2011-04-01 MED ORDER — PHENAZOPYRIDINE HCL 100 MG PO TABS
200.0000 mg | ORAL_TABLET | Freq: Once | ORAL | Status: AC
  Administered 2011-04-01: 200 mg via ORAL
  Filled 2011-04-01: qty 2

## 2011-04-01 MED ORDER — PHENAZOPYRIDINE HCL 200 MG PO TABS: 200.0000 mg | ORAL_TABLET | Freq: Three times a day (TID) | ORAL | Status: AC

## 2011-04-01 NOTE — ED Notes (Signed)
Pt is here with excruciating pain on the right side of lower abdomen and into back.  Pt is unable to urinate.  Pt sts urinary s/s started las nite.  Pt is tachy in the 150s

## 2011-04-01 NOTE — ED Notes (Signed)
Foley bag changed to leg bag, instructions given on changing over to large bag for night use.  Patient with verbalized understanding of instructions.  Family also with verbalized understanding.

## 2011-04-01 NOTE — ED Provider Notes (Signed)
History     CSN: [id number]  Arrival date & time 04/01/11  1446   First MD Initiated Contact with Patient 04/01/11 [phone]      Chief Complaint  Patient presents with  . Urinary Retention    (Consider location/radiation/quality/duration/timing/severity/associated sxs/prior treatment) Patient is a 51 y.o. female presenting with dysuria. The history is provided by the patient.  Dysuria  This is a new problem. The current episode started yesterday. The problem has been gradually worsening. The pain is moderate. There has been no fever. Pertinent negatives include no chills. 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 Last urination was last night. No fever, N, V. .    Past Medical History  Diagnosis Date  . Hypertension   . Stroke     Past Surgical History  Procedure Date  . Abdominal hysterectomy     No family history on file.  History  Substance Use Topics  . Smoking status: Former Games developer  . Smokeless tobacco: Not on file  . Alcohol Use: No    OB History    Grav Para Term Preterm Abortions TAB SAB Ect Mult Living                  Review of Systems  Constitutional: Negative for fever and chills.  HENT: Negative.   Respiratory: Negative.   Cardiovascular: Negative.   Gastrointestinal: Negative.   Genitourinary: Positive for dysuria and difficulty urinating.       See HPI.  Musculoskeletal: Negative.   Skin: Negative.   Neurological: Negative.     Allergies  Codeine and Penicillins  Home Medications   Current Outpatient Rx  Name Route Sig Dispense Refill  . ALPRAZOLAM 1 MG PO TABS Oral Take 1 mg by mouth every morning.    Marland Kitchen AMLODIPINE BESYLATE 5 MG PO TABS Oral Take 5 mg by mouth daily.    . ASPIRIN 325 MG PO TABS Oral Take 325 mg by mouth daily.    Marland Kitchen LEVOTHYROXINE SODIUM 150 MCG PO TABS Oral Take 150 mcg by mouth daily.    Marland Kitchen OLMESARTAN MEDOXOMIL 20 MG PO TABS Oral Take 20  mg by mouth daily.    Marland Kitchen PRAVASTATIN SODIUM 20 MG PO TABS Oral Take 20 mg by mouth daily.      BP 71/53  Pulse 154  Temp(Src) 98.1 F (36.7 C) (Oral)  Resp 24  SpO2 100%  Physical Exam  Constitutional: She is oriented to person, place, and time. She appears well-developed and well-nourished.  Neck: Normal range of motion.  Pulmonary/Chest: Effort normal.  Abdominal: Soft. There is no rebound and no guarding.       Suprapubic tenderness. Bladder distention is not palpable but may be obscured by body habitus.  Genitourinary:       No CVA tenderness.  Neurological: She is alert and oriented to person, place, and time.  Skin: Skin is warm and dry.    ED Course  Procedures (including critical care time)   Labs Reviewed  URINALYSIS, ROUTINE W REFLEX MICROSCOPIC   No results found.   No diagnosis found.    MDM  Approximately 1300 cc clear urine drained. Patient feels much better. Refer to urology.        Rodena Medin, PA-C 04/01/11 1652

## 2011-04-05 NOTE — ED Provider Notes (Signed)
Medical screening examination/treatment/procedure(s) were performed by non-physician practitioner and as supervising physician I was immediately available for consultation/collaboration.  Loren Racer, MD 04/05/11 [phone]

## 2012-04-20 ENCOUNTER — Other Ambulatory Visit: Payer: Self-pay | Admitting: Gynecology

## 2012-04-20 DIAGNOSIS — Z1231 Encounter for screening mammogram for malignant neoplasm of breast: Secondary | ICD-10-CM

## 2012-04-20 DIAGNOSIS — N951 Menopausal and female climacteric states: Secondary | ICD-10-CM

## 2012-05-29 ENCOUNTER — Ambulatory Visit
Admission: RE | Admit: 2012-05-29 | Discharge: 2012-05-29 | Disposition: A | Discharge status: Home / Self Care | Payer: BC Managed Care – PPO | Source: Ambulatory Visit | Attending: Gynecology | Admitting: Gynecology

## 2012-05-29 DIAGNOSIS — Z1231 Encounter for screening mammogram for malignant neoplasm of breast: Secondary | ICD-10-CM

## 2012-05-29 DIAGNOSIS — N951 Menopausal and female climacteric states: Secondary | ICD-10-CM

## 2012-09-28 ENCOUNTER — Emergency Department (HOSPITAL_COMMUNITY): Payer: BC Managed Care – PPO

## 2012-09-28 ENCOUNTER — Emergency Department (HOSPITAL_COMMUNITY)
Admission: EM | Admit: 2012-09-28 | Discharge: 2012-09-28 | Disposition: A | Discharge status: Home / Self Care | Payer: BC Managed Care – PPO | Attending: Emergency Medicine | Admitting: Emergency Medicine

## 2012-09-28 ENCOUNTER — Encounter (HOSPITAL_COMMUNITY): Payer: Self-pay | Admitting: Emergency Medicine

## 2012-09-28 DIAGNOSIS — I10 Essential (primary) hypertension: Secondary | ICD-10-CM | POA: Insufficient documentation

## 2012-09-28 DIAGNOSIS — R51 Headache: Secondary | ICD-10-CM | POA: Insufficient documentation

## 2012-09-28 DIAGNOSIS — G8190 Hemiplegia, unspecified affecting unspecified side: Secondary | ICD-10-CM

## 2012-09-28 DIAGNOSIS — Z8673 Personal history of transient ischemic attack (TIA), and cerebral infarction without residual deficits: Secondary | ICD-10-CM | POA: Insufficient documentation

## 2012-09-28 DIAGNOSIS — F411 Generalized anxiety disorder: Secondary | ICD-10-CM | POA: Insufficient documentation

## 2012-09-28 LAB — RAPID URINE DRUG SCREEN, HOSP PERFORMED
Benzodiazepines: POSITIVE — AB
Cocaine: NOT DETECTED
Opiates: NOT DETECTED

## 2012-09-28 LAB — POCT I-STAT, CHEM 8
BUN: 17 mg/dL (ref 6–23)
Calcium, Ion: 1.15 mmol/L (ref 1.12–1.23)
Chloride: 109 mEq/L (ref 96–112)
Creatinine, Ser: 1 mg/dL (ref 0.50–1.10)
Glucose, Bld: 114 mg/dL — ABNORMAL HIGH (ref 70–99)
HCT: 42 % (ref 36.0–46.0)
Potassium: 4.5 mEq/L (ref 3.5–5.1)

## 2012-09-28 LAB — PROTIME-INR: Prothrombin Time: 12.6 seconds (ref 11.6–15.2)

## 2012-09-28 LAB — COMPREHENSIVE METABOLIC PANEL
ALT: 26 U/L (ref 0–35)
AST: 24 U/L (ref 0–37)
CO2: 21 mEq/L (ref 19–32)
Calcium: 9.8 mg/dL (ref 8.4–10.5)
Creatinine, Ser: 0.89 mg/dL (ref 0.50–1.10)
GFR calc Af Amer: 86 mL/min — ABNORMAL LOW (ref >90)
GFR calc non Af Amer: 74 mL/min — ABNORMAL LOW (ref >90)
Glucose, Bld: 111 mg/dL — ABNORMAL HIGH (ref 70–99)
Sodium: 137 mEq/L (ref 135–145)
Total Protein: 7.6 g/dL (ref 6.0–8.3)

## 2012-09-28 LAB — CBC
HCT: 39.1 % (ref 36.0–46.0)
MCH: 30.6 pg (ref 26.0–34.0)
MCV: 87.5 fL (ref 78.0–100.0)
Platelets: 249 10*3/uL (ref 150–400)
RDW: 13.7 % (ref 11.5–15.5)

## 2012-09-28 LAB — URINE MICROSCOPIC-ADD ON

## 2012-09-28 LAB — URINALYSIS, ROUTINE W REFLEX MICROSCOPIC
Bilirubin Urine: NEGATIVE
Ketones, ur: NEGATIVE mg/dL
Specific Gravity, Urine: 1.009 (ref 1.005–1.030)
pH: 7 (ref 5.0–8.0)

## 2012-09-28 LAB — ETHANOL: Alcohol, Ethyl (B): <11 mg/dL (ref 0–11)

## 2012-09-28 LAB — DIFFERENTIAL
Basophils Absolute: 0.1 10*3/uL (ref 0.0–0.1)
Eosinophils Absolute: 0.2 10*3/uL (ref 0.0–0.7)
Eosinophils Relative: 3 % (ref 0–5)
Lymphocytes Relative: 21 % (ref 12–46)
Monocytes Absolute: 0.5 10*3/uL (ref 0.1–1.0)

## 2012-09-28 NOTE — ED Notes (Signed)
Spoke with Benson Norway, RN of the Stroke Team re: pt possibly needing grief counseling re: her son's death a year ago.  Resources for HPCOG and Heartstrings given to RN to pass along to pt.

## 2012-09-28 NOTE — ED Notes (Signed)
Code stroke cancelled 

## 2012-09-28 NOTE — Consult Note (Signed)
Referring Physician: ED/CODE STROKE   Chief Complaint: right hemiparesis, right face droop, dysarthria.  HPI:                                                                                                                                         Whitney Rasmussen is an 52 y.o. 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 She was last known well at 2 pm today when her coworkers noticed that she was " acting weird" and then started having right face droop, slurred speech and right arm-leg weakness. Denies associated headache, vertigo, double vision, difficulty swallowing, or visual disturbances. Initial neurological examination revealed many inconsistencies but her overall NISS was 0 and urgent CT brain revealed no acute abnormality. She takes aspirin 325 mg daily, pravastatin 20 mg, and amlodipine. Of importance, she reports being on significant emotional distress since the killing of her son 1 year ago.  Date last known well: 09/28/12  Time last known well: 2 pm tPA Given: no, inconsistent neuro-exam and NIHSS 0 NIHSS: 0  Past Medical History  Diagnosis Date  . Hypertension   . Stroke     Past Surgical History  Procedure Laterality Date  . Abdominal hysterectomy      No family history on file. Social History:  reports that she has quit smoking. She does not have any smokeless tobacco history on file. She reports that she does not drink alcohol or use illicit drugs.  Allergies:  Allergies  Allergen Reactions  . Codeine Anaphylaxis  . Penicillins Anaphylaxis    Medications:                                                                                                                           I have reviewed the patient's current medications.  ROS:  History obtained from the patient, husband, and chart review.  General ROS: negative for - chills, fatigue, fever, night sweats, weight gain or weight loss Psychological ROS: negative for - behavioral disorder, hallucinations, memory difficulties, or suicidal ideation Ophthalmic ROS: negative for - blurry vision, double vision, eye pain or loss of vision ENT ROS: negative for - epistaxis, nasal discharge, oral lesions, sore throat, tinnitus or vertigo Allergy and Immunology ROS: negative for - hives or itchy/watery eyes Hematological and Lymphatic ROS: negative for - bleeding problems, bruising or swollen lymph nodes Endocrine ROS: negative for - galactorrhea, hair pattern changes, polydipsia/polyuria or temperature intolerance Respiratory ROS: negative for - cough, hemoptysis, shortness of breath or wheezing Cardiovascular ROS: negative for - chest pain, dyspnea on exertion, edema or irregular heartbeat Gastrointestinal ROS: negative for - abdominal pain, diarrhea, hematemesis, nausea/vomiting or stool incontinence Genito-Urinary ROS: negative for - dysuria, hematuria, incontinence or urinary frequency/urgency Musculoskeletal ROS: negative for - joint swelling Neurological ROS: as noted in HPI Dermatological ROS: negative for rash and skin lesion changes    Physical exam: pleasant female in no apparent distress but emotional. BP 130/80 P 82 R 17, afebrile Head: normocephalic. Neck: supple, no bruits, no JVD. Cardiac: no murmurs. Lungs: clear. Abdomen: soft, no tender, no mass. Extremities: no edema.   Neurologic Examination:                                                                                                      Mental Status: Alert, oriented, thought content appropriate.  Speech fluent without evidence of aphasia.  Able to follow 3 step commands  without difficulty. Cranial Nerves: II: Discs flat bilaterally; Visual fields grossly normal, pupils equal, round, reactive to light and accommodation III,IV, VI: ptosis not present, extra-ocular motions intact bilaterally V,VII: smile symmetric, facial light touch sensation normal bilaterally VIII: hearing normal bilaterally IX,X: gag reflex present XI: bilateral shoulder shrug XII: midline tongue extension Motor: 5/5 all over Tone and bulk:normal tone throughout; no atrophy noted Sensory: Pinprick and light touch intact throughout, bilaterally Deep Tendon Reflexes:  1+ all over  Plantars: Right: downgoing   Left: downgoing Cerebellar: normal finger-to-nose,  normal heel-to-shin test Gait:  No ataxia CV: pulses palpable throughout     Results for orders placed during the hospital encounter of 09/28/12 (from the past 48 hour(s))  POCT I-STAT TROPONIN I     Status: None   Collection Time    09/28/12  3:41 PM      Result Value Range   Troponin i, poc 0.00  0.00 - 0.08 ng/mL   Comment 3            Comment: Due to the release kinetics of cTnI,     a negative result within the first hours     of the onset of symptoms does not rule out     myocardial infarction with certainty.     If myocardial infarction is still suspected,     repeat the test at appropriate intervals.  POCT I-STAT, CHEM 8     Status: Abnormal   Collection  Time    09/28/12  3:43 PM      Result Value Range   Sodium 141  135 - 145 mEq/L   Potassium 4.5  3.5 - 5.1 mEq/L   Chloride 109  96 - 112 mEq/L   BUN 17  6 - 23 mg/dL   Creatinine, Ser 1.61  0.50 - 1.10 mg/dL   Glucose, Bld 096 (*) 70 - 99 mg/dL   Calcium, Ion 0.45  4.09 - 1.23 mmol/L   TCO2 24  0 - 100 mmol/L   Hemoglobin 14.3  12.0 - 15.0 g/dL   HCT 81.1  91.4 - 78.2 %   Ct Head Wo Contrast  09/28/2012   *RADIOLOGY REPORT*  Clinical Data: Code stroke slurred speech facial droop  CT HEAD WITHOUT CONTRAST  Technique:  Contiguous axial images were  obtained from the base of the skull through the vertex without contrast.  Comparison: 04/19/2010  Findings: Chronic infarct left internal capsule as noted previously.  Small chronic infarct left posterior putamen also unchanged.  No acute infarct.  Negative for hemorrhage or mass lesion. Ventricle size is normal and there is no shift to the midline structures.  No bony abnormality.  IMPRESSION: Chronic ischemic changes.  No acute abnormality.  Critical Value/emergent results were called by telephone at the time of interpretation on 09/28/2012 at 1600 hours to Dr. Hurman Horn, who verbally acknowledged these results.   Original Report Authenticated By: Janeece Riggers, M.D.     [facility] [phone]  09/28/2012, 4:08 PM   Assessment: 52 y.o. female with HTN, prior stroke, and acute onset right hemiparesis, right face droop, and dysarthria that has a strong functional component. NIHSS 0 at this moment. CT brain unremarkable. Although she has risk factors for stroke, I am not quite convinced she had suffer and acute cerebrovascular insult. Recommend: 1) MRI-DWI.  2) Will need social work involvement to try to arrange for outpatient counseling.  Stroke Risk Factors - HTN, stroke     Wyatt Portela, MD [facility] [phone]  09/28/2012, 4:08 PM

## 2012-09-28 NOTE — Code Documentation (Signed)
Pt with panic attack.

## 2012-09-28 NOTE — ED Provider Notes (Signed)
TIME OF ENCOUNTER: 09/28/2012 4:11 PM. Nursing triage note reviewed.  PCP: Delorse Lek, MD  CHIEF COMPLAINT: Code Stroke   HISTORY OF PRESENT ILLNESS: Whitney Rasmussen is a 52 y.o. female who presents via EMS from work when she states that chemicals were being used in her workplace, and she became short of breath.  She states she went to sit in her car to "cool off" when her coworkers called EMS bc she was having right-side weakness and difficulty with her speech.  She states she has a hx of left-side CVA with right-side weakness after the stroke that has resolved (except that when she gets stressed she sometimes has problems).  She currently denies any symptoms.  She denies any recent illnesses.  She denies pain.  She denies any fevers.    PAST MEDICAL HISTORY: There are no active problems to display for this patient.   Past Medical History  Diagnosis Date  . Hypertension   . Stroke     Nurses notes read and reviewed and agree unless otherwise noted.  PAST SURGICAL HISTORY: Past Surgical History  Procedure Laterality Date  . Abdominal hysterectomy       SOCIAL HISTORY Patient denies any significant drug or alcohol abuse History  Smoking status  . Former Smoker  Smokeless tobacco  . Not on file    History  Alcohol Use No    History  Drug Use No    FAMILY HISTORY: Reviewed and felt to be non contributory to history of present illness No family history on file.  MEDICATIONS: Meds were reviewed.  ALLERGIES: Allergies  Allergen Reactions  . Codeine Anaphylaxis  . Penicillins Anaphylaxis    REVIEW OF SYSTEMS:  Constitutional  No fever, chills,significant change in weight Integumentary   No lesions, rashes,or significant itching Eyes  No change in vision, diplopia, pain,or discharge Ears/Nose/Throat EARS: DENIES:  Pain or discharge NOSE: DENIES: nosebleeds or obstruction THROAT: DENIES: airway obstruction,  trouble swallowing Cardiovascular  No chest  pain, palpitations, shortness of breath when lying flat Respiratory   No shortness of breath, cough, or wheezing Gastrointestinal  No nausea, vomiting, diarrhea, abdominal pain,  black or bloody stools Genitourinary   PT REPORTS HX OF OVERFLOW INCONTINENCE THAT IS CHRONIC Musculoskeletal  No significant neck or back pain reported Hematologic/Lymphatic  No significant swollen glands or easy bruising reported Neurological  SEE HPI Allergic/Immunologic  No specific immunologic complaint Psychiatric  No specific suicidal or homicidal thoughts reported    All other review of systems is otherwise negative except as above and as described in the HPI.  PHYSICAL EXAM  CONSTITUTIONAL  well developed, well nourished, alert, non toxic appearing; UPSET AND TEARFUL STATING SHE WAS "JUST STRESSED OUT" HEENT  normocephalic, atraumatic, external ears normal, nose normal, mucus membranes moist, oropharynx clear EYES  normal conjunctiva, no sclera icterus noted, EOM intact, PERRL NECK  normal ROM, supple, no JVD, trachea normal, no mass CARDIOVASCULAR  Distant heart sounds, no signifcant murmur noted, full and effective pulses PULMONARY/CHEST WALL  normal effort, no respiratory distress, BS normal, no wheezes, no rhonchi, no rales ABDOMINAL  normal appearance, no distension, no mass, no pulsatile mass; soft, non tender, no rigidity, guarding or rebound MUSCULOSKELETAL  Normal range of motion of extremities;  no midline cervical, thoracic, or lumbar spine tenderness, no peripheral edema, no calf tenderness or cords NEUROLOGICAL  patient is awake and responsive;  no significant motor or sensory deficit noted. CN IN TACT LYMPHATIC  no cervical or other adenopathy appreciated  SKIN  warm, dry, intact, no rash PSYCHIATRIC  No suicidal or homicidal ideations;  Normal affect and expected cognition  DIAGNOSTIC STUDIES      ED PROVIDER INTERPRETATION OF DATA: Laboratory and radiology tests have been ordered  with results reviewed, interpreted and considered in the medical decision making process.     LABS:  ALL LABS REVIEWED. NO SIG ABNML      RADIOLOGY INTERPRETATION:    CT HEAD WITHOUT CONTRAST  Technique: Contiguous axial images were obtained from the base of the skull through the vertex without contrast.  Comparison: 04/19/2010  Findings: Chronic infarct left internal capsule as noted previously. Small chronic infarct left posterior putamen also unchanged.  No acute infarct. Negative for hemorrhage or mass lesion. Ventricle size is normal and there is no shift to the midline structures. No bony abnormality.  IMPRESSION: Chronic ischemic changes. No acute abnormality.     RADIOLOGY REPORT*  Clinical Data: Acute right hemiparesis with right facial droop. Slurred speech. High blood pressure. Remote infarct 2012 resulting in mild right-sided weakness. Hypertension.  MRI HEAD WITHOUT CONTRAST  Technique: Multiplanar, multiecho pulse sequences of the brain and surrounding structures were obtained according to standard protocol without intravenous contrast.  Comparison: 09/28/2012 CT. 04/19/2010 MR.  Findings: No acute infarct.  Remote small infarct posterior left caudate/lenticular nucleus.  Remote anterior limb left internal capsule infarct.  Relatively similar appearance of nonspecific white matter type changes which in the present clinical setting are most likely reflective of result of small vessel disease rather than result of demyelinating process, inflammatory process or vasculitis.  Major intracranial vascular structures are patent with small right vertebral artery once again noted.  Mild cervical spondylotic changes. Cervical medullary junction unremarkable. Partially empty sella once again noted.  IMPRESSION: No acute infarct.  Remote small infarct posterior left caudate/lenticular nucleus.  Remote anterior limb left internal capsule  infarct.  Relatively similar appearance of nonspecific white matter type changes which in the present clinical setting are most likely reflective of result of small vessel disease      MEDICAL DECISION MAKING:  Nursing notes read, reviewed.   Pt with hx of left-side CVA, now states she had right-side weakness and slurred speech which as resolved.  She states this happens sometimes with panic attacks.  Neuro eval'd initially by Code Stroke, but they did not feel sx c/w CVA.  They did rec MRI and other metabolic/toxic/infectious workup.  This was unremarkable.  Pt remains stable and with nml exam.  Spoke to her regarding lab/rad findings and d/dx as well as reasons to return to ED.      DISPOSITION  DISPOSITION:  DISCHARGE HOME   Condition: STABLE;Improved, Pt well appearing at disposition time.    Verbalized discussion with pt about disposition.  Usual and customary return precautions given.   DISCHARGE PRESCRIPTIONS/INSTRUCTIONS:  Discharge instructions given to patient.  Patient and/or family expressed understanding of instructions and return precautions.    Ronna Polio, ANN   Note: Portions of this report may have been transcribed using voice recognition software. Every effort was made to ensure accuracy; however, inadvertent computerized transcription errors may be present   Ashby Dawes, MD 09/28/12 1901

## 2012-09-28 NOTE — Code Documentation (Addendum)
52yo female presenting as CODE STROKE with c/o R facial droop and stuttering speech after being at work in a new location where she noted fumes this afternoon that caused eye irritation and difficulty breathing.  EMS notified, [facility] activated code stroke at 1521.  LKW 1400.  Stroke team arrived at 51 and neurologist arrived at 35.  On exam NIHSS 0, forced facial deviation which subsides when distracted and symptoms appear functional.  Patient has a h/o small L caudate stroke with only a right sided weakness deficit when tired, otherwise, back to baseline s/p stroke.  Apparently the patient's son died 1 year ago and she is actively grieving.  Stroke risk factors include OSA for which she has CPAP, has not been compliant recently.  She reports not wearing it because she can cry at night and her husband cannot hear her if she is not wearing the mask.  When grief counseling suggested, husband immediately said that "they did not need any of that junk." Patient indicates that she thinks she could benefit from counseling.  Discussed with Child psychotherapist and neurologist.  Code stroke cancelled and handoff completed with ED RN.

## 2012-09-28 NOTE — ED Notes (Signed)
Pt brought to ED by EMS with rt side weakness and slurred speech and rt side facial droop.

## 2012-09-28 NOTE — ED Notes (Signed)
Code stroke cancelled at 1600

## 2013-01-22 ENCOUNTER — Ambulatory Visit (INDEPENDENT_AMBULATORY_CARE_PROVIDER_SITE_OTHER): Payer: BC Managed Care – PPO | Admitting: Licensed Clinical Social Worker

## 2013-01-22 DIAGNOSIS — F321 Major depressive disorder, single episode, moderate: Secondary | ICD-10-CM

## 2013-02-07 ENCOUNTER — Ambulatory Visit (INDEPENDENT_AMBULATORY_CARE_PROVIDER_SITE_OTHER): Payer: BC Managed Care – PPO | Admitting: Licensed Clinical Social Worker

## 2013-02-07 DIAGNOSIS — F321 Major depressive disorder, single episode, moderate: Secondary | ICD-10-CM

## 2013-02-28 ENCOUNTER — Ambulatory Visit: Payer: BC Managed Care – PPO | Admitting: Licensed Clinical Social Worker

## 2013-04-02 ENCOUNTER — Ambulatory Visit (INDEPENDENT_AMBULATORY_CARE_PROVIDER_SITE_OTHER): Payer: BC Managed Care – PPO | Admitting: Licensed Clinical Social Worker

## 2013-04-02 DIAGNOSIS — F321 Major depressive disorder, single episode, moderate: Secondary | ICD-10-CM

## 2013-04-11 ENCOUNTER — Ambulatory Visit (INDEPENDENT_AMBULATORY_CARE_PROVIDER_SITE_OTHER): Payer: BC Managed Care – PPO | Admitting: Licensed Clinical Social Worker

## 2013-04-11 DIAGNOSIS — F321 Major depressive disorder, single episode, moderate: Secondary | ICD-10-CM

## 2013-05-02 ENCOUNTER — Ambulatory Visit: Payer: BC Managed Care – PPO | Admitting: Licensed Clinical Social Worker

## 2013-05-30 ENCOUNTER — Ambulatory Visit (INDEPENDENT_AMBULATORY_CARE_PROVIDER_SITE_OTHER): Payer: BC Managed Care – PPO | Admitting: Licensed Clinical Social Worker

## 2013-05-30 DIAGNOSIS — F321 Major depressive disorder, single episode, moderate: Secondary | ICD-10-CM

## 2013-07-04 ENCOUNTER — Ambulatory Visit (INDEPENDENT_AMBULATORY_CARE_PROVIDER_SITE_OTHER): Payer: BC Managed Care – PPO | Admitting: Licensed Clinical Social Worker

## 2013-07-04 DIAGNOSIS — F321 Major depressive disorder, single episode, moderate: Secondary | ICD-10-CM

## 2013-08-08 ENCOUNTER — Ambulatory Visit (INDEPENDENT_AMBULATORY_CARE_PROVIDER_SITE_OTHER): Payer: BC Managed Care – PPO | Admitting: Licensed Clinical Social Worker

## 2013-08-08 DIAGNOSIS — F321 Major depressive disorder, single episode, moderate: Secondary | ICD-10-CM

## 2013-08-20 ENCOUNTER — Encounter: Payer: BC Managed Care – PPO | Attending: Family Medicine | Admitting: Dietician

## 2013-08-20 ENCOUNTER — Encounter: Payer: Self-pay | Admitting: Dietician

## 2013-08-20 VITALS — Ht 66.5 in | Wt 208.9 lb

## 2013-08-20 DIAGNOSIS — I10 Essential (primary) hypertension: Secondary | ICD-10-CM | POA: Insufficient documentation

## 2013-08-20 DIAGNOSIS — Z713 Dietary counseling and surveillance: Secondary | ICD-10-CM | POA: Insufficient documentation

## 2013-08-20 DIAGNOSIS — E669 Obesity, unspecified: Secondary | ICD-10-CM | POA: Insufficient documentation

## 2013-08-20 NOTE — Progress Notes (Signed)
Medical Nutrition Therapy:  Appt start time: 0930 end time:  1030.  Assessment:  Primary concerns today: Recent weight gain, obesity, HTN. Pt has been severely restricting intake in an effort to lose weight, but has been gaining some weight. She routinely skips lunch and only has a fiber one bar for breakfast. She also claims to have never been a big "drinker", with her usual intake at only 2-3 cups of coffee per day with little or no water or any other liquid. She claims that in the past she would drink 2 pots of coffee per day, then dropped to one pot, now about 1/2 pot of coffee. She states she routinely puts off drinking anything until she is very thirsty in order to prevent ankle and foot swelling. She has a number of food aversions and notable fixations about food, including the very restrictive eating pattern with meal skipping, headaches related to lack of caffeine (likely related to long term very high coffee intake), avoidance of any cold liquids, distaste for essentially all liquids other than coffee, and distaste/unwillingness to try multiple meats, breads, vegetables, fruits, and milk. She also mentions quite a few bits of misinformation she has been told over the years and recently about food, including that vegetables are full of sugar, yogurt is bad for you, weight loss is all about limiting food, not drinking anything will prevent foot and ankle swelling, and some others. She also reports that her GI physician is concerned about her bowel elimination schedule (which pt notes as being about once per 5 days consistently for multiple decades), possibly related to recent discovery of colon polyps.   Pt reports that with attempts at weight loss and stress at work she finds herself very tired. Consistent dehydration and very low kcal intake may play a role.   Preferred Learning Style:   No preference indicated   Learning Readiness:   Contemplating  MEDICATIONS: see list.   DIETARY  INTAKE: Usual eating pattern includes 1-3 meals and 0 snacks per day. Everyday foods include coffee.  Avoided foods include many.    24-hr recall:  B ( AM): fiber one protein bar, cup coffee with tsp sugar  Snk ( AM): none  L ( PM): may skip lunch. Usually a yogurt or pack of lance crackers Snk ( PM): none D ( PM): turnip greens, pinto beans, [place] burgers. Grilled chix, corn, green beans, sweet peas, black eyed peas, grilled zucchini and squash, possibly another pack of lance crackers. Lots of salad (lettuce, chix or [place], shredded cheese, [place] dressing- lightly dressed, sometimes berries) Snk ( PM): none. Used to enjoy more sweets Beverages: limited. Pt claims only the cup of coffee in AM, then another cup or 2 of coffee in PM. Claims to dislike anything cold to drink. Minimal taste of water.    Usual physical activity: minimal. 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   Progress Towards Goal(s):  In progress.   Nutritional Diagnosis:  NB-1.2 Harmful beliefs/attitudes about food or nutrition-related topics (use with caution) As related to restrictive diet and hydration pattern.  As evidenced by pt diet recall, pt reports of drastic eating and drinking limitations.    Intervention:  Nutrition counseling on less restrictive eating and drinking behaviors, alterations to mindset about diet, food in general, and correction of misinformation. RD explained the key need for consistent protein intake and a moderate kcal intake to maintain energy output, and recommended 30 grams of protein  per meal at 3 meals per day at minimum, and ideally also with a high fiber food at each meal. RD noted high protein and fiber options, and thoroughly explained how to obtain 30 grams of protein from options the pt found appealing. Rd placed particular emphasis on protein foods and nonstarchy vegetables as key foods to eat throughout the day to maximize weight loss. 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 RD recommended 3 g/day fish oil to aid HTN control.  RD recommended exercise as time allows, with goal of 30 minutes total throughout each day.  Teaching Method Utilized:  Visual Auditory  Handouts given during visit include:  Best Protein, Fat, and CHO rich foods for heart health  High Protein, High Fiber foods  Barriers to learning/adherence to lifestyle change: anxiety, misconceptions about diet and health, limited acceptance of a variety of foods  Demonstrated degree of understanding via:  Teach Back   Monitoring/Evaluation:  Dietary intake, exercise, restrictive eating, and body weight in 2 month(s).

## 2013-08-22 ENCOUNTER — Ambulatory Visit: Payer: BC Managed Care – PPO | Admitting: Dietician

## 2013-09-05 ENCOUNTER — Ambulatory Visit (INDEPENDENT_AMBULATORY_CARE_PROVIDER_SITE_OTHER): Payer: BC Managed Care – PPO | Admitting: Licensed Clinical Social Worker

## 2013-09-05 DIAGNOSIS — F321 Major depressive disorder, single episode, moderate: Secondary | ICD-10-CM

## 2013-10-10 ENCOUNTER — Ambulatory Visit (INDEPENDENT_AMBULATORY_CARE_PROVIDER_SITE_OTHER): Payer: BC Managed Care – PPO | Admitting: Licensed Clinical Social Worker

## 2013-10-10 DIAGNOSIS — F321 Major depressive disorder, single episode, moderate: Secondary | ICD-10-CM

## 2013-10-22 ENCOUNTER — Encounter: Payer: BC Managed Care – PPO | Attending: Family Medicine | Admitting: Dietician

## 2013-10-22 VITALS — Ht 66.5 in | Wt 205.8 lb

## 2013-10-22 DIAGNOSIS — Z713 Dietary counseling and surveillance: Secondary | ICD-10-CM | POA: Insufficient documentation

## 2013-10-22 DIAGNOSIS — E669 Obesity, unspecified: Secondary | ICD-10-CM | POA: Insufficient documentation

## 2013-10-22 DIAGNOSIS — I10 Essential (primary) hypertension: Secondary | ICD-10-CM | POA: Insufficient documentation

## 2013-10-22 NOTE — Patient Instructions (Addendum)
-  Look into getting a pedometer or fitbit  -Set a step goal (see what you're averaging and add 2,000)  -Work on getting 64 oz of water per day  

## 2013-10-22 NOTE — Progress Notes (Signed)
Medical Nutrition Therapy:  Appt start time: 800 end time:  830.  Follow-Up:  [name] returns today having lost 3 pounds since her last visit 2 months ago. She reports increased energy level. She tried Dispensing opticianremier protein shakes and replaces lunch with them. She has begun to limit starchy vegetables. However, she still cannot find time to exercise. She reports that she is under a lot of family stress: she had a stroke several years ago, she cares for her disabled husband, and her son was killed 2 years ago. She has cut back on coffee significantly, but is still not getting enough water and reports "awful leg cramps."  She has been seeing a psychologist at [facility] for self-esteem and stress management.  Preferred Learning Style:   No preference indicated   Learning Readiness:   Ready  MEDICATIONS: see list.   DIETARY INTAKE: Usual eating pattern includes 1-3 meals and 0 snacks per day. Everyday foods include coffee.  Avoided foods include many.    24-hr recall:  B ( AM): fiber one protein bar, cup coffee with tsp sugar  Snk ( AM): none  L ( PM): may skip lunch. Usually a yogurt or pack of lance crackers Snk ( PM): none D ( PM): turnip greens, pinto beans, [place] burgers. Grilled chix, corn, green beans, sweet peas, black eyed peas, grilled zucchini and squash, possibly another pack of lance crackers. Lots of salad (lettuce, chix or [place], shredded cheese, [place] dressing- lightly dressed, sometimes berries) Snk ( PM): none. Used to enjoy more sweets Beverages: limited. Pt claims only the cup of coffee in AM, then another cup or 2 of coffee in PM. Claims to dislike anything cold to drink. Minimal taste of water.    Usual physical activity: minimal. 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   Progress Towards Goal(s):  In progress.   Nutritional Diagnosis:  NB-1.2 Harmful beliefs/attitudes about food or nutrition-related topics (use with caution) As related to  restrictive diet and hydration pattern.  As evidenced by pt diet recall, pt reports of drastic eating and drinking limitations.    Intervention:  Nutrition counseling provided.  Teaching Method Utilized:  Visual Auditory  Barriers to learning/adherence to lifestyle change: anxiety, misconceptions about diet and health, limited acceptance of a variety of foods  Demonstrated degree of understanding via:  Teach Back   Monitoring/Evaluation:  Dietary intake, exercise, restrictive eating, and body weight in 2 month(s).

## 2013-11-07 ENCOUNTER — Ambulatory Visit (INDEPENDENT_AMBULATORY_CARE_PROVIDER_SITE_OTHER): Payer: BC Managed Care – PPO | Admitting: Licensed Clinical Social Worker

## 2013-11-07 DIAGNOSIS — F321 Major depressive disorder, single episode, moderate: Secondary | ICD-10-CM

## 2013-12-05 ENCOUNTER — Ambulatory Visit (INDEPENDENT_AMBULATORY_CARE_PROVIDER_SITE_OTHER): Payer: BC Managed Care – PPO | Admitting: Licensed Clinical Social Worker

## 2013-12-05 DIAGNOSIS — F321 Major depressive disorder, single episode, moderate: Secondary | ICD-10-CM

## 2013-12-24 ENCOUNTER — Encounter: Payer: BC Managed Care – PPO | Attending: Family Medicine | Admitting: Dietician

## 2013-12-24 DIAGNOSIS — E669 Obesity, unspecified: Secondary | ICD-10-CM | POA: Diagnosis present

## 2013-12-24 DIAGNOSIS — Z713 Dietary counseling and surveillance: Secondary | ICD-10-CM | POA: Diagnosis not present

## 2013-12-24 DIAGNOSIS — Z6832 Body mass index (BMI) 32.0-32.9, adult: Secondary | ICD-10-CM | POA: Insufficient documentation

## 2013-12-24 NOTE — Patient Instructions (Signed)
-  Look into getting a pedometer or fitbit  -Set a step goal (see what you're averaging and add 2,000)  -Work on getting 64 oz of water per day

## 2013-12-24 NOTE — Progress Notes (Signed)
Medical Nutrition Therapy:  Appt start time: 800 end time:  830.  Follow-Up:  Whitney ArnoldStacy returns today stating she does not wish to be weighed today. She reports she was down to 198 pounds recently. She got a fitbit and has increased average steps from 1700 to 4000-5000 steps. Still having some stress at home, just started Effexor XR. She is replacing meals Premier protein shakes. Has increased sleep to 6-7 hours a night and is still seeing therapist for stress. Getting 90 grams of protein per day and has more energy overall.   Preferred Learning Style:   No preference indicated   Learning Readiness:   Ready  MEDICATIONS: see list.   DIETARY INTAKE: Usual eating pattern includes 1-3 meals and 0 snacks per day. Everyday foods include coffee.  Avoided foods include many.   Beverages: lots of coffee, drinking to drink a bottle of water at night   Usual physical activity: minimal. 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   Progress Towards Goal(s):  In progress.   Nutritional Diagnosis:  NB-1.2 Harmful beliefs/attitudes about food or nutrition-related topics (use with caution) As related to restrictive diet and hydration pattern.  As evidenced by pt diet recall, pt reports of drastic eating and drinking limitations.    Intervention:  Nutrition counseling provided.  Teaching Method Utilized:  Visual Auditory  Barriers to learning/adherence to lifestyle change: anxiety, misconceptions about diet and health, limited acceptance of a variety of foods  Demonstrated degree of understanding via:  Teach Back   Monitoring/Evaluation:  Dietary intake, exercise, restrictive eating, and body weight in 4 month(s).

## 2013-12-31 ENCOUNTER — Ambulatory Visit (INDEPENDENT_AMBULATORY_CARE_PROVIDER_SITE_OTHER): Payer: BC Managed Care – PPO | Admitting: Licensed Clinical Social Worker

## 2013-12-31 DIAGNOSIS — F322 Major depressive disorder, single episode, severe without psychotic features: Secondary | ICD-10-CM

## 2014-01-30 ENCOUNTER — Ambulatory Visit (INDEPENDENT_AMBULATORY_CARE_PROVIDER_SITE_OTHER): Payer: BC Managed Care – PPO | Admitting: Licensed Clinical Social Worker

## 2014-01-30 DIAGNOSIS — F322 Major depressive disorder, single episode, severe without psychotic features: Secondary | ICD-10-CM

## 2014-02-27 ENCOUNTER — Ambulatory Visit (INDEPENDENT_AMBULATORY_CARE_PROVIDER_SITE_OTHER): Payer: BC Managed Care – PPO | Admitting: Licensed Clinical Social Worker

## 2014-02-27 DIAGNOSIS — F322 Major depressive disorder, single episode, severe without psychotic features: Secondary | ICD-10-CM

## 2014-03-20 ENCOUNTER — Ambulatory Visit (INDEPENDENT_AMBULATORY_CARE_PROVIDER_SITE_OTHER): Payer: BLUE CROSS/BLUE SHIELD | Admitting: Licensed Clinical Social Worker

## 2014-03-20 DIAGNOSIS — F322 Major depressive disorder, single episode, severe without psychotic features: Secondary | ICD-10-CM

## 2014-04-10 ENCOUNTER — Ambulatory Visit: Payer: Self-pay | Admitting: Licensed Clinical Social Worker

## 2014-04-24 ENCOUNTER — Ambulatory Visit (INDEPENDENT_AMBULATORY_CARE_PROVIDER_SITE_OTHER): Payer: BLUE CROSS/BLUE SHIELD | Admitting: Licensed Clinical Social Worker

## 2014-04-24 DIAGNOSIS — F322 Major depressive disorder, single episode, severe without psychotic features: Secondary | ICD-10-CM

## 2014-05-01 ENCOUNTER — Ambulatory Visit: Payer: BC Managed Care – PPO | Admitting: Dietician

## 2014-05-29 ENCOUNTER — Ambulatory Visit (INDEPENDENT_AMBULATORY_CARE_PROVIDER_SITE_OTHER): Payer: BLUE CROSS/BLUE SHIELD | Admitting: Licensed Clinical Social Worker

## 2014-05-29 DIAGNOSIS — F322 Major depressive disorder, single episode, severe without psychotic features: Secondary | ICD-10-CM | POA: Diagnosis not present

## 2014-06-05 ENCOUNTER — Encounter: Payer: BLUE CROSS/BLUE SHIELD | Attending: Family Medicine | Admitting: Dietician

## 2014-06-05 DIAGNOSIS — Z713 Dietary counseling and surveillance: Secondary | ICD-10-CM | POA: Insufficient documentation

## 2014-06-05 DIAGNOSIS — Z6832 Body mass index (BMI) 32.0-32.9, adult: Secondary | ICD-10-CM | POA: Diagnosis not present

## 2014-06-05 DIAGNOSIS — E669 Obesity, unspecified: Secondary | ICD-10-CM

## 2014-06-05 NOTE — Patient Instructions (Signed)
-  Look into getting a pedometer or fitbit  -Set a step goal (see what you're averaging and add 2,000)  -Work on getting 64 oz of water per day  

## 2014-06-05 NOTE — Progress Notes (Signed)
Medical Nutrition Therapy:  Appt start time: 805 end time:  845  Follow-Up:  Whitney Rasmussen returns today stating she does not wish to be weighed today; she states that she can tell her pants are tighter. He husband's health has declined further and she cares for him in addition to working full time. She reports a lot of guilt and stress in her daily life. She continues to see her counselor regularly. She is still replacing breakfast with Premier protein shakes. However, she has been eating dinner very late, often a Chickfila salad or a baked potato with butter spread.   Goal:  -Aim for 6,000 steps per day -Add a protein to dinner (chili and/or plain [place] yogurt)  Preferred Learning Style:   No preference indicated   Learning Readiness:   Ready  MEDICATIONS: see list.   DIETARY INTAKE: Usual eating pattern includes 1-3 meals and 0 snacks per day. Everyday foods include coffee.  Avoided foods include many.   Beverages: lots of coffee, drinking to drink a bottle of water at night   Usual physical activity: minimal. 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   Progress Towards Goal(s):  In progress.   Nutritional Diagnosis:  NB-1.2 Harmful beliefs/attitudes about food or nutrition-related topics (use with caution) As related to restrictive diet and hydration pattern.  As evidenced by pt diet recall, pt reports of drastic eating and drinking limitations.    Intervention:  Nutrition counseling provided.  Teaching Method Utilized:  Visual Auditory  Barriers to learning/adherence to lifestyle change: anxiety, misconceptions about diet and health, limited acceptance of a variety of foods  Demonstrated degree of understanding via:  Teach Back   Monitoring/Evaluation:  Dietary intake, exercise, restrictive eating, and body weight in 3 month(s).

## 2014-06-26 ENCOUNTER — Ambulatory Visit: Payer: BLUE CROSS/BLUE SHIELD | Admitting: Licensed Clinical Social Worker

## 2014-06-28 ENCOUNTER — Ambulatory Visit (INDEPENDENT_AMBULATORY_CARE_PROVIDER_SITE_OTHER): Payer: BLUE CROSS/BLUE SHIELD | Admitting: Licensed Clinical Social Worker

## 2014-06-28 DIAGNOSIS — F322 Major depressive disorder, single episode, severe without psychotic features: Secondary | ICD-10-CM | POA: Diagnosis not present

## 2014-07-26 ENCOUNTER — Other Ambulatory Visit: Payer: Self-pay | Admitting: Gynecology

## 2014-07-26 DIAGNOSIS — R928 Other abnormal and inconclusive findings on diagnostic imaging of breast: Secondary | ICD-10-CM

## 2014-07-31 ENCOUNTER — Ambulatory Visit
Admission: RE | Admit: 2014-07-31 | Discharge: 2014-07-31 | Disposition: A | Discharge status: Home / Self Care | Payer: BLUE CROSS/BLUE SHIELD | Source: Ambulatory Visit | Attending: Gynecology | Admitting: Gynecology

## 2014-07-31 DIAGNOSIS — R928 Other abnormal and inconclusive findings on diagnostic imaging of breast: Secondary | ICD-10-CM

## 2014-09-05 ENCOUNTER — Ambulatory Visit: Payer: Self-pay | Admitting: Dietician

## 2014-11-13 ENCOUNTER — Ambulatory Visit (INDEPENDENT_AMBULATORY_CARE_PROVIDER_SITE_OTHER): Payer: BLUE CROSS/BLUE SHIELD | Admitting: Licensed Clinical Social Worker

## 2014-11-13 DIAGNOSIS — F322 Major depressive disorder, single episode, severe without psychotic features: Secondary | ICD-10-CM

## 2014-11-27 ENCOUNTER — Ambulatory Visit (INDEPENDENT_AMBULATORY_CARE_PROVIDER_SITE_OTHER): Payer: BLUE CROSS/BLUE SHIELD | Admitting: Licensed Clinical Social Worker

## 2014-11-27 DIAGNOSIS — F322 Major depressive disorder, single episode, severe without psychotic features: Secondary | ICD-10-CM

## 2014-11-28 ENCOUNTER — Ambulatory Visit: Payer: BLUE CROSS/BLUE SHIELD | Admitting: Dietician

## 2014-12-18 ENCOUNTER — Ambulatory Visit (INDEPENDENT_AMBULATORY_CARE_PROVIDER_SITE_OTHER): Payer: BLUE CROSS/BLUE SHIELD | Admitting: Licensed Clinical Social Worker

## 2014-12-18 DIAGNOSIS — F322 Major depressive disorder, single episode, severe without psychotic features: Secondary | ICD-10-CM

## 2014-12-25 ENCOUNTER — Ambulatory Visit (INDEPENDENT_AMBULATORY_CARE_PROVIDER_SITE_OTHER): Payer: BLUE CROSS/BLUE SHIELD | Admitting: Licensed Clinical Social Worker

## 2014-12-25 DIAGNOSIS — F322 Major depressive disorder, single episode, severe without psychotic features: Secondary | ICD-10-CM

## 2015-01-01 ENCOUNTER — Ambulatory Visit (INDEPENDENT_AMBULATORY_CARE_PROVIDER_SITE_OTHER): Payer: BLUE CROSS/BLUE SHIELD | Admitting: Licensed Clinical Social Worker

## 2015-01-01 DIAGNOSIS — F322 Major depressive disorder, single episode, severe without psychotic features: Secondary | ICD-10-CM

## 2015-01-13 ENCOUNTER — Ambulatory Visit (INDEPENDENT_AMBULATORY_CARE_PROVIDER_SITE_OTHER): Payer: BLUE CROSS/BLUE SHIELD | Admitting: Licensed Clinical Social Worker

## 2015-01-13 DIAGNOSIS — F322 Major depressive disorder, single episode, severe without psychotic features: Secondary | ICD-10-CM | POA: Diagnosis not present

## 2015-01-24 ENCOUNTER — Ambulatory Visit (INDEPENDENT_AMBULATORY_CARE_PROVIDER_SITE_OTHER): Payer: BLUE CROSS/BLUE SHIELD | Admitting: Licensed Clinical Social Worker

## 2015-01-24 DIAGNOSIS — F322 Major depressive disorder, single episode, severe without psychotic features: Secondary | ICD-10-CM | POA: Diagnosis not present

## 2015-02-12 ENCOUNTER — Ambulatory Visit (INDEPENDENT_AMBULATORY_CARE_PROVIDER_SITE_OTHER): Payer: BLUE CROSS/BLUE SHIELD | Admitting: Licensed Clinical Social Worker

## 2015-02-12 DIAGNOSIS — F322 Major depressive disorder, single episode, severe without psychotic features: Secondary | ICD-10-CM

## 2015-12-23 ENCOUNTER — Other Ambulatory Visit: Payer: Self-pay | Admitting: Gynecology

## 2015-12-23 DIAGNOSIS — Z1231 Encounter for screening mammogram for malignant neoplasm of breast: Secondary | ICD-10-CM

## 2016-01-01 ENCOUNTER — Ambulatory Visit
Admission: RE | Admit: 2016-01-01 | Discharge: 2016-01-01 | Disposition: A | Discharge status: Home / Self Care | Payer: Self-pay | Source: Ambulatory Visit | Attending: Gynecology | Admitting: Gynecology

## 2016-01-01 DIAGNOSIS — Z1231 Encounter for screening mammogram for malignant neoplasm of breast: Secondary | ICD-10-CM

## 2016-01-27 ENCOUNTER — Ambulatory Visit (INDEPENDENT_AMBULATORY_CARE_PROVIDER_SITE_OTHER): Payer: 59 | Admitting: Licensed Clinical Social Worker

## 2016-01-27 DIAGNOSIS — F3341 Major depressive disorder, recurrent, in partial remission: Secondary | ICD-10-CM

## 2016-02-20 ENCOUNTER — Ambulatory Visit (INDEPENDENT_AMBULATORY_CARE_PROVIDER_SITE_OTHER): Payer: 59 | Admitting: Licensed Clinical Social Worker

## 2016-02-20 DIAGNOSIS — F3341 Major depressive disorder, recurrent, in partial remission: Secondary | ICD-10-CM

## 2016-03-23 ENCOUNTER — Ambulatory Visit (INDEPENDENT_AMBULATORY_CARE_PROVIDER_SITE_OTHER): Payer: 59 | Admitting: Licensed Clinical Social Worker

## 2016-03-23 DIAGNOSIS — F3341 Major depressive disorder, recurrent, in partial remission: Secondary | ICD-10-CM

## 2016-04-20 ENCOUNTER — Ambulatory Visit (INDEPENDENT_AMBULATORY_CARE_PROVIDER_SITE_OTHER): Payer: 59 | Admitting: Licensed Clinical Social Worker

## 2016-04-20 DIAGNOSIS — F3341 Major depressive disorder, recurrent, in partial remission: Secondary | ICD-10-CM | POA: Diagnosis not present

## 2016-06-09 ENCOUNTER — Ambulatory Visit (INDEPENDENT_AMBULATORY_CARE_PROVIDER_SITE_OTHER): Payer: 59 | Admitting: Licensed Clinical Social Worker

## 2016-06-09 DIAGNOSIS — F3341 Major depressive disorder, recurrent, in partial remission: Secondary | ICD-10-CM

## 2016-06-30 ENCOUNTER — Ambulatory Visit (INDEPENDENT_AMBULATORY_CARE_PROVIDER_SITE_OTHER): Payer: 59 | Admitting: Podiatry

## 2016-06-30 ENCOUNTER — Ambulatory Visit (INDEPENDENT_AMBULATORY_CARE_PROVIDER_SITE_OTHER): Payer: 59

## 2016-06-30 ENCOUNTER — Encounter: Payer: Self-pay | Admitting: Podiatry

## 2016-06-30 DIAGNOSIS — M722 Plantar fascial fibromatosis: Secondary | ICD-10-CM | POA: Diagnosis not present

## 2016-06-30 MED ORDER — DICLOFENAC SODIUM 75 MG PO TBEC: 75.0000 mg | DELAYED_RELEASE_TABLET | Freq: Two times a day (BID) | ORAL | 2 refills | Status: DC

## 2016-06-30 MED ORDER — TRIAMCINOLONE ACETONIDE 10 MG/ML IJ SUSP
10.0000 mg | Freq: Once | INTRAMUSCULAR | Status: AC
  Administered 2016-06-30: 10 mg

## 2016-06-30 NOTE — Patient Instructions (Signed)

## 2016-06-30 NOTE — Progress Notes (Signed)
Subjective:     Patient ID: Whitney Rasmussen, female   DOB: [birth year], 56 y.o.   MRN: [id number]  HPI patient presents with severe pain in the right plantar fascia at the insertional point of the tendon into the calcaneus with inflammation fluid buildup of 3 months duration   Review of Systems  All other systems reviewed and are negative.      Objective:   Physical Exam  Constitutional: She is oriented to person, place, and time.  Cardiovascular: Intact distal pulses.   Musculoskeletal: Normal range of motion.  Neurological: She is oriented to person, place, and time.  Skin: Skin is warm.  Nursing note and vitals reviewed.  neurovascular status intact with muscle strength adequate range of motion within normal limits. Patient's found to have exquisite discomfort in the plantar aspect of the right heel at the insertional point of the tendon into the calcaneus with inflammation fluid buildup around the tendon. He has good digital perfusion and is well oriented 3     Assessment:     Acute plantar fasciitis right heel insertional point to the calcaneus    Plan:     H&P condition reviewed and at this time I injected the right plantar fascia 3 Milligan Kenalog 5 mill grams Xylocaine and applied fascial brace gave instructions on stretching exercises and physical therapy and we'll start diclofenac 75 mg twice a day. Patient checked back again in 2 weeks  X-ray indicates small spur with no indication of stress fracture

## 2016-06-30 NOTE — Progress Notes (Signed)
   Subjective:    Patient ID: Whitney Rasmussen, female    DOB: [birth year], 56 y.o.   MRN: [id number]  HPI Chief Complaint  Patient presents with  . Foot Pain    Right foot; bottom of heel; x2 months; pt stated, "Hurts more in the morning"      Review of Systems  Constitutional: Positive for fatigue.  Cardiovascular: Positive for leg swelling.  Musculoskeletal: Positive for gait problem.  All other systems reviewed and are negative.      Objective:   Physical Exam        Assessment & Plan:

## 2016-07-02 ENCOUNTER — Encounter: Payer: Self-pay | Admitting: Podiatry

## 2016-07-07 ENCOUNTER — Ambulatory Visit (INDEPENDENT_AMBULATORY_CARE_PROVIDER_SITE_OTHER): Payer: 59 | Admitting: Licensed Clinical Social Worker

## 2016-07-07 DIAGNOSIS — F3341 Major depressive disorder, recurrent, in partial remission: Secondary | ICD-10-CM | POA: Diagnosis not present

## 2016-07-16 ENCOUNTER — Ambulatory Visit (INDEPENDENT_AMBULATORY_CARE_PROVIDER_SITE_OTHER): Payer: 59 | Admitting: Podiatry

## 2016-07-16 ENCOUNTER — Encounter: Payer: Self-pay | Admitting: Podiatry

## 2016-07-16 DIAGNOSIS — M722 Plantar fascial fibromatosis: Secondary | ICD-10-CM

## 2016-07-17 NOTE — Progress Notes (Signed)
Subjective:    Patient ID: Whitney Rasmussen, female   DOB: 56 y.o.   MRN: [id number]   HPI patient states my right heel is feeling significantly better with minimal pain    ROS      Objective:  Physical Exam Neurovascular status unchanged with pain that has receded by about 90% from previous treatments administered    Assessment:    Significant improvement plantar fasciitis right     Plan:     Advised on anti-inflammatories physical therapy and usage of shoe gear modification. Patient's discharge and less needs to be seen again

## 2016-08-25 ENCOUNTER — Ambulatory Visit (INDEPENDENT_AMBULATORY_CARE_PROVIDER_SITE_OTHER): Payer: 59 | Admitting: Licensed Clinical Social Worker

## 2016-08-25 DIAGNOSIS — F3341 Major depressive disorder, recurrent, in partial remission: Secondary | ICD-10-CM

## 2016-09-22 ENCOUNTER — Ambulatory Visit (INDEPENDENT_AMBULATORY_CARE_PROVIDER_SITE_OTHER): Payer: BLUE CROSS/BLUE SHIELD | Admitting: Licensed Clinical Social Worker

## 2016-09-22 DIAGNOSIS — F3341 Major depressive disorder, recurrent, in partial remission: Secondary | ICD-10-CM

## 2016-10-26 ENCOUNTER — Ambulatory Visit: Payer: 59 | Admitting: Licensed Clinical Social Worker

## 2016-11-17 ENCOUNTER — Ambulatory Visit (INDEPENDENT_AMBULATORY_CARE_PROVIDER_SITE_OTHER): Payer: BLUE CROSS/BLUE SHIELD | Admitting: Licensed Clinical Social Worker

## 2016-11-17 DIAGNOSIS — F3341 Major depressive disorder, recurrent, in partial remission: Secondary | ICD-10-CM | POA: Diagnosis not present

## 2016-12-17 ENCOUNTER — Ambulatory Visit: Payer: 59 | Admitting: Licensed Clinical Social Worker

## 2017-01-03 ENCOUNTER — Other Ambulatory Visit: Payer: Self-pay | Admitting: Gynecology

## 2017-01-03 DIAGNOSIS — Z1231 Encounter for screening mammogram for malignant neoplasm of breast: Secondary | ICD-10-CM

## 2017-01-18 ENCOUNTER — Ambulatory Visit
Admission: RE | Admit: 2017-01-18 | Discharge: 2017-01-18 | Disposition: A | Discharge status: Home / Self Care | Payer: BLUE CROSS/BLUE SHIELD | Source: Ambulatory Visit | Attending: Gynecology | Admitting: Gynecology

## 2017-01-18 DIAGNOSIS — Z1231 Encounter for screening mammogram for malignant neoplasm of breast: Secondary | ICD-10-CM

## 2017-01-20 ENCOUNTER — Other Ambulatory Visit: Payer: Self-pay | Admitting: Gynecology

## 2017-01-20 DIAGNOSIS — R928 Other abnormal and inconclusive findings on diagnostic imaging of breast: Secondary | ICD-10-CM

## 2017-03-17 ENCOUNTER — Ambulatory Visit
Admission: RE | Admit: 2017-03-17 | Discharge: 2017-03-17 | Disposition: A | Discharge status: Home / Self Care | Payer: BLUE CROSS/BLUE SHIELD | Source: Ambulatory Visit | Attending: Gynecology | Admitting: Gynecology

## 2017-03-17 ENCOUNTER — Ambulatory Visit: Payer: Self-pay

## 2017-03-17 DIAGNOSIS — R928 Other abnormal and inconclusive findings on diagnostic imaging of breast: Secondary | ICD-10-CM

## 2019-06-01 ENCOUNTER — Ambulatory Visit: Payer: BC Managed Care – PPO | Attending: Internal Medicine

## 2019-06-01 DIAGNOSIS — Z23 Encounter for immunization: Secondary | ICD-10-CM

## 2019-06-01 NOTE — Progress Notes (Signed)
   Covid-19 Vaccination Clinic  Name:  Whitney Rasmussen    MRN: [id number] DOB: [birth year]  06/01/2019  [name] was observed post Covid-19 immunization for 15 minutes without incident. She was provided with Vaccine Information Sheet and instruction to access the V-Safe system.   [name] was instructed to call 911 with any severe reactions post vaccine: Marland Kitchen Difficulty breathing  . Swelling of face and throat  . A fast heartbeat  . A bad rash all over body  . Dizziness and weakness   Immunizations Administered    Name Date Dose VIS Date Route   Pfizer COVID-19 Vaccine 06/01/2019  9:47 AM 0.3 mL 02/23/2019 Intramuscular   Manufacturer: [employer], [employer]   Lot: [id number]   NDC: [address]-5

## 2019-07-02 ENCOUNTER — Ambulatory Visit: Payer: BC Managed Care – PPO | Attending: Internal Medicine

## 2019-07-02 DIAGNOSIS — Z23 Encounter for immunization: Secondary | ICD-10-CM

## 2019-07-02 NOTE — Progress Notes (Signed)
   Covid-19 Vaccination Clinic  Name:  Whitney Rasmussen    MRN: [id number] DOB: 1960/11/25  07/02/2019  [name] was observed post Covid-19 immunization for 15 minutes without incident. She was provided with Vaccine Information Sheet and instruction to access the V-Safe system.   [name] was instructed to call 911 with any severe reactions post vaccine: Marland Kitchen Difficulty breathing  . Swelling of face and throat  . A fast heartbeat  . A bad rash all over body  . Dizziness and weakness   Immunizations Administered    Name Date Dose VIS Date Route   Pfizer COVID-19 Vaccine 07/02/2019  9:05 AM 0.3 mL 05/09/2018 Intramuscular   Manufacturer: [employer], [employer]   Lot: [id number]   NDC: [address]-5

## 2019-12-27 ENCOUNTER — Ambulatory Visit: Payer: BC Managed Care – PPO

## 2019-12-27 ENCOUNTER — Other Ambulatory Visit: Payer: Self-pay | Admitting: Gynecology

## 2019-12-27 DIAGNOSIS — Z1231 Encounter for screening mammogram for malignant neoplasm of breast: Secondary | ICD-10-CM

## 2020-01-23 ENCOUNTER — Ambulatory Visit
Admission: RE | Admit: 2020-01-23 | Discharge: 2020-01-23 | Disposition: A | Discharge status: Home / Self Care | Payer: BC Managed Care – PPO | Source: Ambulatory Visit | Attending: Gynecology | Admitting: Gynecology

## 2020-01-23 ENCOUNTER — Other Ambulatory Visit: Payer: Self-pay

## 2020-01-23 DIAGNOSIS — Z1231 Encounter for screening mammogram for malignant neoplasm of breast: Secondary | ICD-10-CM

## 2020-06-25 ENCOUNTER — Encounter: Payer: Self-pay | Admitting: Orthopaedic Surgery

## 2020-06-25 ENCOUNTER — Ambulatory Visit: Payer: BC Managed Care – PPO | Admitting: Physician Assistant

## 2020-06-25 ENCOUNTER — Ambulatory Visit: Payer: Self-pay

## 2020-06-25 DIAGNOSIS — M25512 Pain in left shoulder: Secondary | ICD-10-CM | POA: Diagnosis not present

## 2020-06-25 DIAGNOSIS — G8929 Other chronic pain: Secondary | ICD-10-CM

## 2020-06-25 MED ORDER — METHYLPREDNISOLONE ACETATE 40 MG/ML IJ SUSP
40.0000 mg | INTRAMUSCULAR | Status: AC | PRN
Start: 2020-06-25 — End: 2020-06-25
  Administered 2020-06-25: 40 mg via INTRA_ARTICULAR

## 2020-06-25 MED ORDER — LIDOCAINE HCL 1 % IJ SOLN
3.0000 mL | INTRAMUSCULAR | Status: AC | PRN
  Administered 2020-06-25: 3 mL

## 2020-06-25 NOTE — Progress Notes (Signed)
Office Visit Note   Patient: Whitney Rasmussen           Date of Birth: [birth year]           MRN: [id number] Visit Date: 06/25/2020              Requested by: Delorse Lek, MD [address] [place],  [place] [address] PCP: Delorse Lek, MD   Assessment & Plan: Visit Diagnoses:  1. Chronic left shoulder pain     Plan: She is shown wall crawls, Codman, pendulum and forward flexion exercises.  She will try these on her home.  If she continues to have pain despite these exercises and the injection the would recommend she follow-up in 2 weeks at that time we will reevaluate her strength and range of motion may consider MRI to rule out rotator cuff tear.  Questions were encouraged and answered at length.  Follow-Up Instructions: Return in about 2 weeks (around 07/09/2020).   Orders:  Orders Placed This Encounter  Procedures  . Large Joint Inj  . XR Shoulder Left   No orders of the defined types were placed in this encounter.     Procedures: Large Joint Inj: L subacromial bursa on 06/25/2020 10:16 AM Indications: pain Details: 22 G 1.5 in needle, superior approach  Arthrogram: No  Medications: 3 mL lidocaine 1 %; 40 mg methylPREDNISolone acetate 40 MG/ML Outcome: tolerated well, no immediate complications Procedure, treatment alternatives, risks and benefits explained, specific risks discussed. Consent was given by the patient. Immediately prior to procedure a time out was called to verify the correct patient, procedure, equipment, support staff and site/side marked as required. Patient was prepped and draped in the usual sterile fashion.       Clinical Data: No additional findings.   Subjective: Chief Complaint  Patient presents with  . Left Shoulder - Pain    HPI Whitney Rasmussen is a 60 year old female were seen for the first time comes in today with left shoulder pain for at least a year.  She cares for her husband full-time as he has Parkinson's  and dementia.  She has to do a lot of lifting on him.  She has had no particular injury.  However she is having increased pain in the shoulder and decreased range of motion strength she states she may drop her husband at some point time.  She is nondiabetic. Review of Systems Denies any fevers or chills  Objective: Vital Signs: There were no vitals taken for this visit.  Physical Exam Constitutional:      Appearance: She is not ill-appearing or diaphoretic.  Pulmonary:     Effort: Pulmonary effort is normal.  Neurological:     Mental Status: She is alert and oriented to person, place, and time.  Psychiatric:        Mood and Affect: Mood normal.     Ortho Exam Right shoulder full range of motion without pain.  Right shoulder full strength.  Left shoulder positive impingement.  No weakness with external or internal rotation against resistance.  Empty can test is negative bilaterally.  Liftoff test she has weakness with on the left full-strength on the right. Specialty Comments:  No specialty comments available.  Imaging: XR Shoulder Left  Result Date: 06/25/2020 Left shoulder 3 views: Shoulders well located.  No acute fractures or bony abnormalities.  Shoulder is well located.    PMFS History: Patient Active Problem List   Diagnosis Date Noted  .  Obesity, unspecified 08/20/2013  . Essential hypertension, benign 08/20/2013   Past Medical History:  Diagnosis Date  . Hypertension   . Stroke [facility])     Family History  Problem Relation Age of Onset  . Breast cancer Neg Hx     Past Surgical History:  Procedure Laterality Date  . ABDOMINAL HYSTERECTOMY     Social History   Occupational History  . Not on file  Tobacco Use  . Smoking status: Former Games developer  . Smokeless tobacco: Not on file  Substance and Sexual Activity  . Alcohol use: No  . Drug use: No  . Sexual activity: Not on file

## 2021-01-15 ENCOUNTER — Other Ambulatory Visit: Payer: Self-pay | Admitting: Gynecology

## 2021-01-15 DIAGNOSIS — Z1231 Encounter for screening mammogram for malignant neoplasm of breast: Secondary | ICD-10-CM

## 2021-02-19 ENCOUNTER — Ambulatory Visit
Admission: RE | Admit: 2021-02-19 | Discharge: 2021-02-19 | Disposition: A | Discharge status: Home / Self Care | Payer: BC Managed Care – PPO | Source: Ambulatory Visit | Attending: Gynecology | Admitting: Gynecology

## 2021-02-19 DIAGNOSIS — Z1231 Encounter for screening mammogram for malignant neoplasm of breast: Secondary | ICD-10-CM

## 2022-01-25 ENCOUNTER — Other Ambulatory Visit: Payer: Self-pay | Admitting: Family Medicine

## 2022-01-25 DIAGNOSIS — Z1231 Encounter for screening mammogram for malignant neoplasm of breast: Secondary | ICD-10-CM

## 2022-02-25 ENCOUNTER — Ambulatory Visit: Payer: BC Managed Care – PPO

## 2022-04-15 ENCOUNTER — Ambulatory Visit
Admission: RE | Admit: 2022-04-15 | Discharge: 2022-04-15 | Disposition: A | Discharge status: Home / Self Care | Payer: BC Managed Care – PPO | Source: Ambulatory Visit | Attending: Family Medicine | Admitting: Family Medicine

## 2022-04-15 DIAGNOSIS — Z1231 Encounter for screening mammogram for malignant neoplasm of breast: Secondary | ICD-10-CM

## 2022-04-20 ENCOUNTER — Other Ambulatory Visit: Payer: Self-pay | Admitting: Family Medicine

## 2022-04-20 DIAGNOSIS — R928 Other abnormal and inconclusive findings on diagnostic imaging of breast: Secondary | ICD-10-CM

## 2022-04-30 ENCOUNTER — Ambulatory Visit
Admission: RE | Admit: 2022-04-30 | Discharge: 2022-04-30 | Disposition: A | Discharge status: Home / Self Care | Payer: BC Managed Care – PPO | Source: Ambulatory Visit | Attending: Family Medicine | Admitting: Family Medicine

## 2022-04-30 ENCOUNTER — Other Ambulatory Visit: Payer: Self-pay | Admitting: Family Medicine

## 2022-04-30 DIAGNOSIS — R928 Other abnormal and inconclusive findings on diagnostic imaging of breast: Secondary | ICD-10-CM

## 2022-04-30 DIAGNOSIS — N6320 Unspecified lump in the left breast, unspecified quadrant: Secondary | ICD-10-CM

## 2022-05-07 ENCOUNTER — Ambulatory Visit
Admission: RE | Admit: 2022-05-07 | Discharge: 2022-05-07 | Disposition: A | Discharge status: Home / Self Care | Payer: BC Managed Care – PPO | Source: Ambulatory Visit | Attending: Family Medicine | Admitting: Family Medicine

## 2022-05-07 DIAGNOSIS — N6320 Unspecified lump in the left breast, unspecified quadrant: Secondary | ICD-10-CM

## 2022-05-07 DIAGNOSIS — R928 Other abnormal and inconclusive findings on diagnostic imaging of breast: Secondary | ICD-10-CM

## 2022-05-07 HISTORY — PX: BREAST BIOPSY: SHX20

## 2023-04-26 IMAGING — MG MM DIGITAL SCREENING BILAT W/ TOMO AND CAD
6 of 10 series · 6 of 30 positions shown · non-contrast
Comparison: Previous exam(s).

CLINICAL DATA: Screening.

EXAM:
DIGITAL SCREENING BILATERAL MAMMOGRAM WITH TOMOSYNTHESIS AND CAD
TECHNIQUE: Bilateral screening digital craniocaudal and mediolateral oblique
mammograms were obtained. Bilateral screening digital breast
tomosynthesis was performed. The images were evaluated with
computer-aided detection.

[L MLO synth-2D]
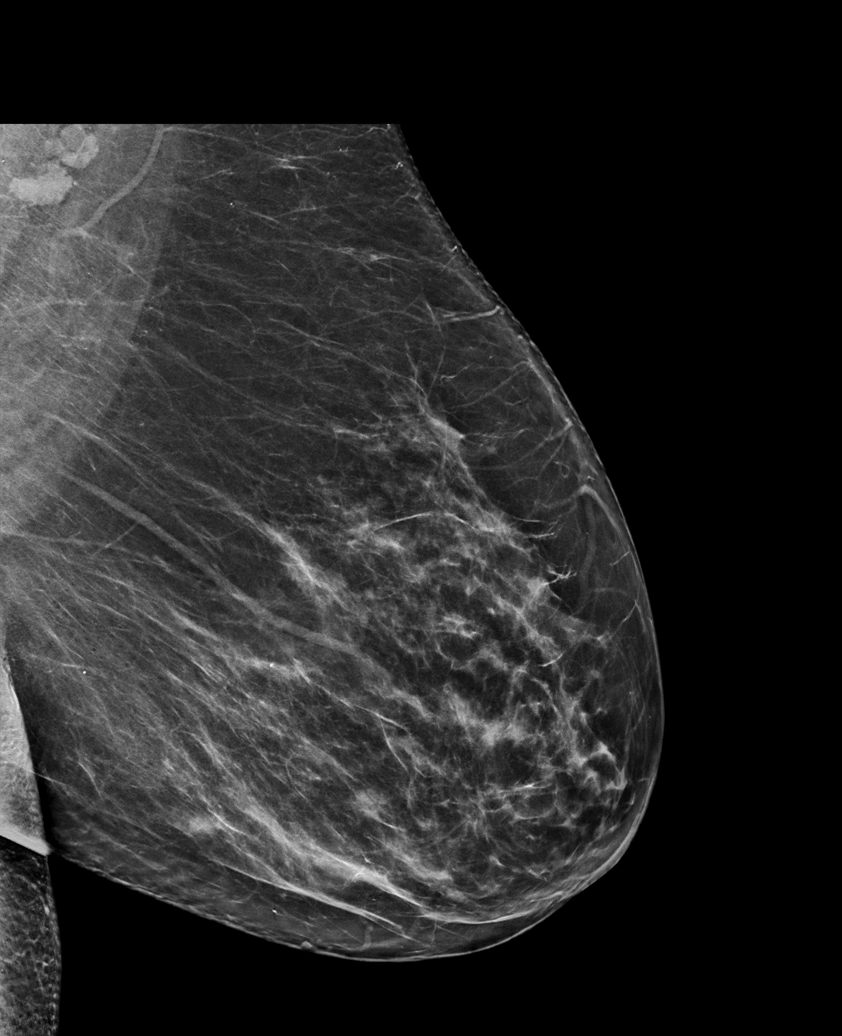

[R CC synth-2D]
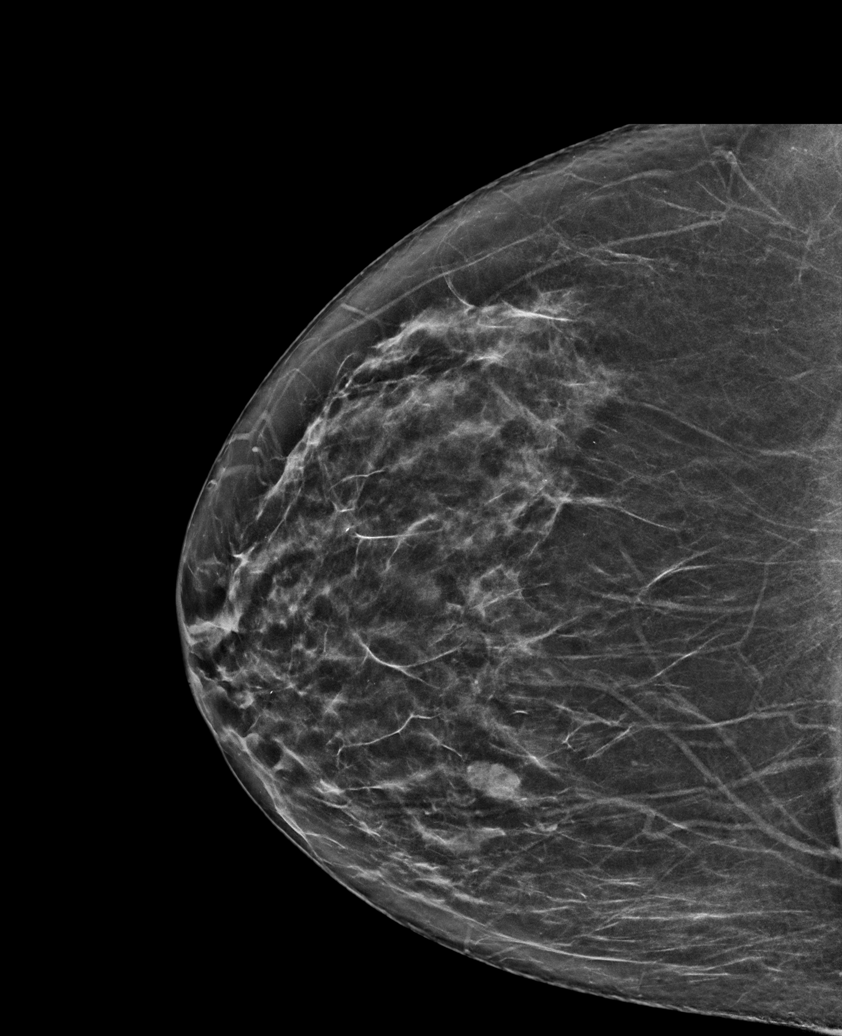

[L CC synth-2D]
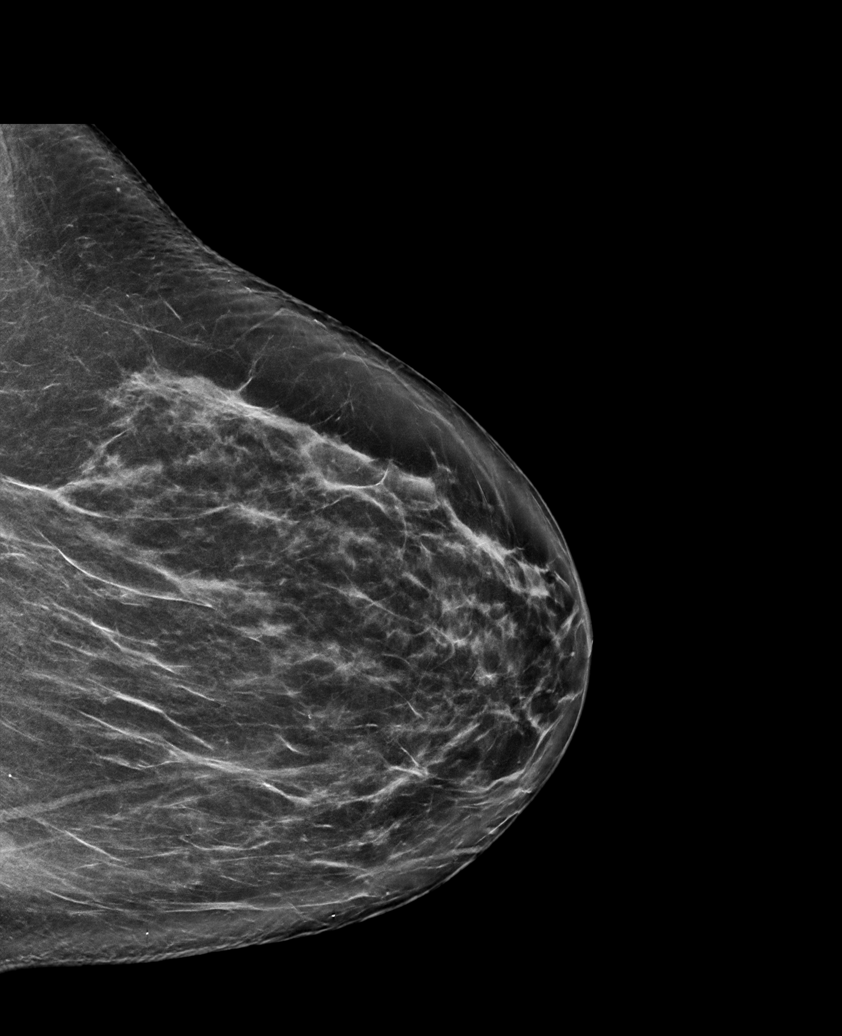

[R CV synth-2D]
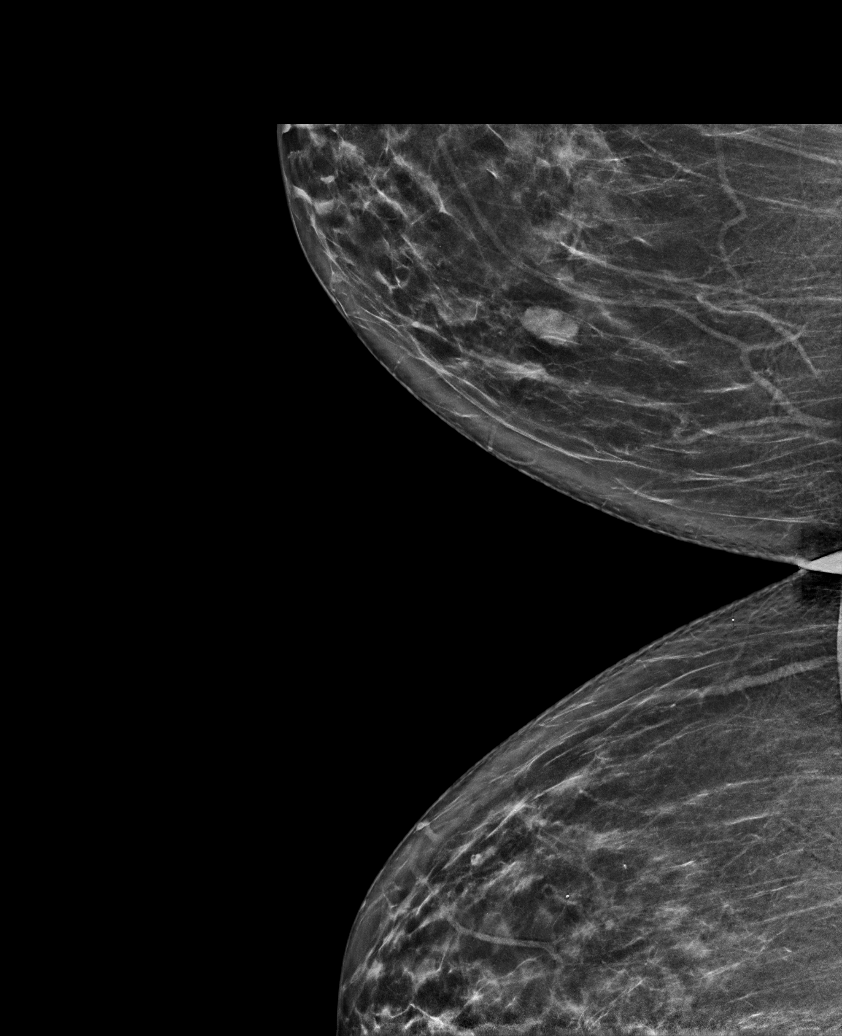

[R MLO synth-2D]
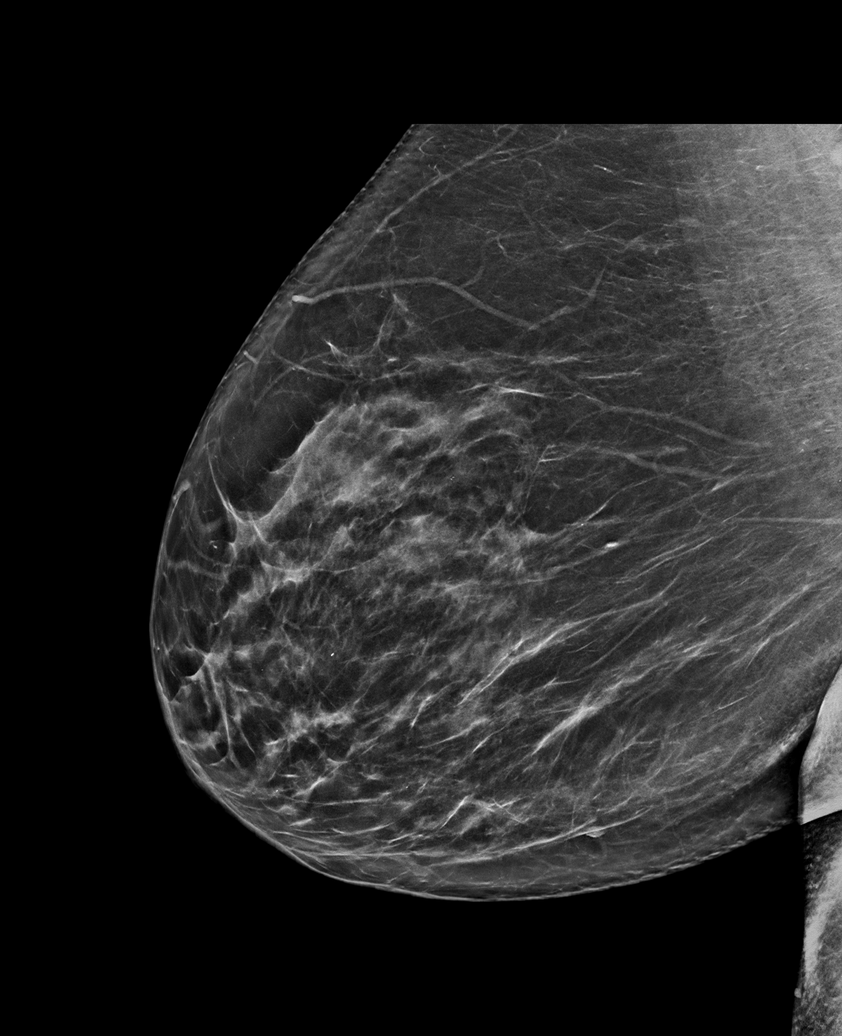

[R CV tomo · tomo slice 36/71.0]
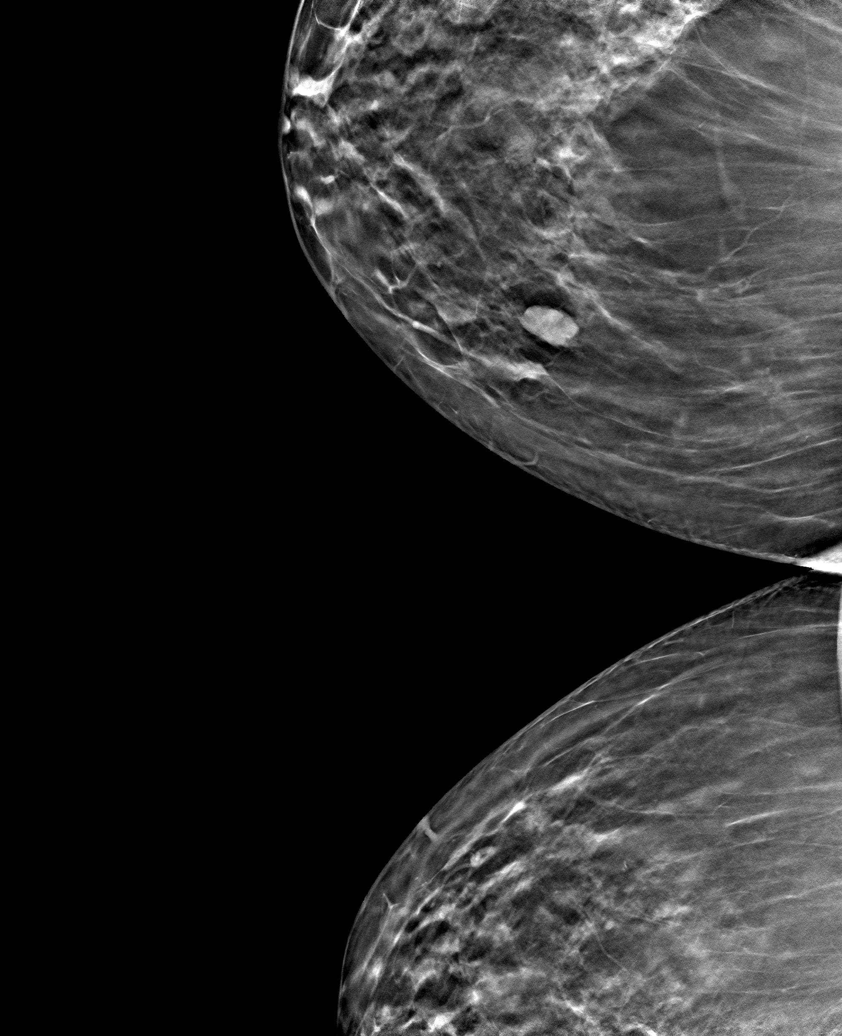

[6 of 30 positions shown; findings below may reference images not displayed]

ACR Breast Density Category c: The breast tissue is heterogeneously
dense, which may obscure small masses.
FINDINGS: There are no findings suspicious for malignancy.
IMPRESSION: No mammographic evidence of malignancy. A result letter of this
screening mammogram will be mailed directly to the patient.

RECOMMENDATION:
Screening mammogram in one year. (Code:Q3-W-BC3)

BI-RADS CATEGORY  1: Negative.

## 2023-05-10 ENCOUNTER — Other Ambulatory Visit: Payer: Self-pay | Admitting: Family Medicine

## 2023-05-10 DIAGNOSIS — Z1231 Encounter for screening mammogram for malignant neoplasm of breast: Secondary | ICD-10-CM

## 2023-05-17 ENCOUNTER — Ambulatory Visit
Admission: RE | Admit: 2023-05-17 | Discharge: 2023-05-17 | Disposition: A | Discharge status: Home / Self Care | Payer: BC Managed Care – PPO | Source: Ambulatory Visit | Attending: Family Medicine | Admitting: Family Medicine

## 2023-05-17 DIAGNOSIS — Z1231 Encounter for screening mammogram for malignant neoplasm of breast: Secondary | ICD-10-CM
